# Patient Record
Sex: Male | Born: 1985
Health system: Southern US, Community
[De-identification: ages and names within clinical notes are randomized; demographics above are authoritative.]

## PROBLEM LIST (undated history)

## (undated) DIAGNOSIS — E119 Type 2 diabetes mellitus without complications: Secondary | ICD-10-CM

## (undated) HISTORY — PX: KNEE SURGERY: SHX244

---

## 2016-12-28 ENCOUNTER — Ambulatory Visit (INDEPENDENT_AMBULATORY_CARE_PROVIDER_SITE_OTHER): Payer: Self-pay

## 2016-12-28 ENCOUNTER — Other Ambulatory Visit: Payer: Self-pay

## 2016-12-28 ENCOUNTER — Encounter (HOSPITAL_COMMUNITY): Payer: Self-pay | Admitting: Emergency Medicine

## 2016-12-28 ENCOUNTER — Ambulatory Visit (HOSPITAL_COMMUNITY)
Admission: EM | Admit: 2016-12-28 | Discharge: 2016-12-28 | Disposition: A | Discharge status: Home / Self Care | Payer: Self-pay | Attending: Family Medicine | Admitting: Family Medicine

## 2016-12-28 DIAGNOSIS — M25562 Pain in left knee: Secondary | ICD-10-CM

## 2016-12-28 DIAGNOSIS — M25462 Effusion, left knee: Secondary | ICD-10-CM

## 2016-12-28 MED ORDER — PREDNISONE 20 MG PO TABS
ORAL_TABLET | ORAL | 0 refills | Status: DC
Start: 2016-12-28 — End: 2018-08-13

## 2016-12-28 NOTE — Discharge Instructions (Signed)
X-rays show no bone damage. Use the knee sleeve. 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 If symptoms continue, you will need to see your orthopedist again.

## 2016-12-28 NOTE — ED Provider Notes (Signed)
  [facility]   [id number] 12/28/16 Arrival Time: 1235   SUBJECTIVE:  Dustin Giles is a 31 y.o. male who presents to the urgent care with complaint of left knee swelling and pain.  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   History reviewed. No pertinent past medical history. Family History  Problem Relation Age of Onset  . Hypertension Father    Social History   Socioeconomic History  . Marital status: Single    Spouse name: Not on file  . Number of children: Not on file  . Years of education: Not on file  . Highest education level: Not on file  Social Needs  . Financial resource strain: Not on file  . Food insecurity - worry: Not on file  . Food insecurity - inability: Not on file  . Transportation needs - medical: Not on file  . Transportation needs - non-medical: Not on file  Occupational History  . Not on file  Tobacco Use  . Smoking status: Never Smoker  Substance and Sexual Activity  . Alcohol use: No    Frequency: Never  . Drug use: Not on file  . Sexual activity: Not on file  Other Topics Concern  . Not on file  Social History Narrative  . Not on file   No outpatient medications have been marked as taking for the 12/28/16 encounter [facility](Hospital Encounter).   No Known Allergies    ROS: As per HPI, remainder of ROS negative.   OBJECTIVE:   Vitals:   12/28/16 1321  BP: 129/75  Pulse: 75  Resp: 16  Temp: 98.4 F (36.9 C)  SpO2: 100%     General appearance: alert; no distress Eyes: PERRL; EOMI; conjunctiva normal HENT: normocephalic; atraumatic; Neck: supple Back: no CVA tenderness Extremities: no cyanosis or edema; symmetrical with left knee having a mild to moderate knee effusion obvious.  There is a medial  arthroscopic surgery scar.  There is no localized tenderness.  There is no ligamentous laxity. Skin: warm and dry Neurologic: normal gait; grossly normal Psychological: alert and cooperative; normal mood and affect      Labs:  No results found for this or any previous visit.  Labs Reviewed - No data to display  No results found.     ASSESSMENT & PLAN:  1. Effusion of left knee     Meds ordered this encounter  Medications  . predniSONE (DELTASONE) 20 MG tablet    Sig: Two daily with food    Dispense:  10 tablet    Refill:  0    Reviewed expectations re: course of current medical issues. Questions answered. Outlined signs and symptoms indicating need for more acute intervention. Patient verbalized understanding. After Visit Summary given.    Procedures:      Elvina SidleLauenstein, Merrell Rettinger, MD 12/28/16 1441

## 2016-12-28 NOTE — ED Triage Notes (Addendum)
Pt states "I went to take a test monday at the [employer], I jumped over a fence, I was fine afterward, the next morning on Tuesday my left knee started hurting with some swelling."

## 2017-06-06 ENCOUNTER — Other Ambulatory Visit: Payer: Self-pay

## 2017-06-06 ENCOUNTER — Encounter (HOSPITAL_COMMUNITY): Payer: Self-pay | Admitting: *Deleted

## 2017-06-06 ENCOUNTER — Ambulatory Visit (HOSPITAL_COMMUNITY)
Admission: EM | Admit: 2017-06-06 | Discharge: 2017-06-06 | Disposition: A | Discharge status: Home / Self Care | Payer: PRIVATE HEALTH INSURANCE | Attending: Internal Medicine | Admitting: Internal Medicine

## 2017-06-06 ENCOUNTER — Ambulatory Visit (INDEPENDENT_AMBULATORY_CARE_PROVIDER_SITE_OTHER): Payer: PRIVATE HEALTH INSURANCE

## 2017-06-06 DIAGNOSIS — S63501A Unspecified sprain of right wrist, initial encounter: Secondary | ICD-10-CM

## 2017-06-06 MED ORDER — IBUPROFEN 800 MG PO TABS: 800.0000 mg | ORAL_TABLET | Freq: Three times a day (TID) | ORAL | 0 refills | Status: DC

## 2017-06-06 NOTE — Discharge Instructions (Signed)
Use anti-inflammatories for pain/swelling. You may take up to 800 mg Ibuprofen every 8 hours with food. You may supplement Ibuprofen with Tylenol [phone] mg every 8 hours.   Apply ice multiple times a day for 20 minutes  Use brace for 1 week, then as needed for comfort

## 2017-06-06 NOTE — ED Triage Notes (Signed)
Playing basketball and fell on his right wrist, per pt it happened on Monday, per pt if he bends it forward it hurts,

## 2017-06-06 NOTE — ED Provider Notes (Signed)
[facility]    CSN: [id number] Arrival date & time: 06/06/17  1423     History   Chief Complaint Chief Complaint  Patient presents with  . Fall    HPI Dustin Giles is a 32 y.o. male presenting today for evaluation of right wrist injury.  Patient states that he was playing basketball on Monday and fell on outstretched hand.  Since he has had persistent pain, pain worsening with flexion, extension and radial deviation.  Noting weird painful sensation over the carpal area of his wrist.  Denies numbness or tingling.  Has been taking Tylenol.  Applying ice.  Patient concerned because he has a physical fit testing next week for the [employer].  HPI  History reviewed. No pertinent past medical history.  There are no active problems to display for this patient.   Past Surgical History:  Procedure Laterality Date  . KNEE SURGERY         Home Medications    Prior to Admission medications   Medication Sig Start Date End Date Taking? Authorizing Provider  ibuprofen (ADVIL,MOTRIN) 800 MG tablet Take 1 tablet (800 mg total) by mouth 3 (three) times daily. 06/06/17   Allsion Nogales C, PA-C  predniSONE (DELTASONE) 20 MG tablet Two daily with food 12/28/16   Elvina Sidle, MD    Family History Family History  Problem Relation Age of Onset  . Hypertension Father     Social History Social History   Tobacco Use  . Smoking status: Never Smoker  . Smokeless tobacco: Never Used  Substance Use Topics  . Alcohol use: No    Frequency: Never  . Drug use: Not on file     Allergies   Patient has no known allergies.   Review of Systems Review of Systems  Constitutional: Negative for activity change, appetite change and fever.  Eyes: Negative for visual disturbance.  Respiratory: Negative for shortness of breath.   Cardiovascular: Negative for chest pain.  Gastrointestinal: Negative for abdominal pain, nausea and vomiting.  Musculoskeletal: Positive for  arthralgias, joint swelling and myalgias. Negative for gait problem, neck pain and neck stiffness.  Skin: Negative for wound.  Neurological: Negative for dizziness, syncope, speech difficulty, weakness, light-headedness, numbness and headaches.     Physical Exam Triage Vital Signs ED Triage Vitals  Enc Vitals Group     BP 06/06/17 1512 131/72     Pulse Rate 06/06/17 1512 64     Resp --      Temp 06/06/17 1512 97.8 F (36.6 C)     Temp Source 06/06/17 1512 Oral     SpO2 06/06/17 1512 100 %     Weight --      Height --      Head Circumference --      Peak Flow --      Pain Score 06/06/17 1513 7     Pain Loc --      Pain Edu? --      Excl. in GC? --    No data found.  Updated Vital Signs BP 131/72 (BP Location: Left Arm)   Pulse 64   Temp 97.8 F (36.6 C) (Oral)   SpO2 100%   Visual Acuity Right Eye Distance:   Left Eye Distance:   Bilateral Distance:    Right Eye Near:   Left Eye Near:    Bilateral Near:     Physical Exam  Constitutional: He appears well-developed and well-nourished.  HENT:  Head: Normocephalic and atraumatic.  Eyes: Conjunctivae are normal.  Neck: Neck supple.  Cardiovascular: Normal rate and regular rhythm.  No murmur heard. Pulmonary/Chest: Effort normal and breath sounds normal. No respiratory distress.  Abdominal: Soft. There is no tenderness.  Musculoskeletal: He exhibits no edema.  No obvious deformity or swelling to right wrist, nontender to palpation of distal radius and ulna, tenderness over carpals, nontender to anatomical snuffbox, radial pulse 2+ cap refill less than 2 seconds.  Neurological: He is alert.  Skin: Skin is warm and dry.  Psychiatric: He has a normal mood and affect.  Nursing note and vitals reviewed.    UC Treatments / Results  Labs (all labs ordered are listed, but only abnormal results are displayed) Labs Reviewed - No data to display  EKG None  Radiology Dg Wrist Complete Right  Result Date:  06/06/2017 CLINICAL DATA:  Fall 6 days ago with pain. EXAM: RIGHT WRIST - COMPLETE 3+ VIEW COMPARISON:  None. FINDINGS: There is no evidence of fracture or dislocation. There is no evidence of arthropathy or other focal bone abnormality. Soft tissues are unremarkable. IMPRESSION: Negative. Electronically Signed   By: Gerome Samavid  Williams III M.D   On: 06/06/2017 15:41    Procedures Procedures (including critical care time)  Medications Ordered in UC Medications - No data to display  Initial Impression / Assessment and Plan / UC Course  I have reviewed the triage vital signs and the nursing notes.  Pertinent labs & imaging results that were available during my care of the patient were reviewed by me and considered in my medical decision making (see chart for details).     X-ray negative for acute fracture.  Will provide wrist brace for support.  Pain likely wrist sprain.  Discussed NSAIDs, ice, expect gradual improvement with time.Discussed strict return precautions. Patient verbalized understanding and is agreeable with plan.  Final Clinical Impressions(s) / UC Diagnoses   Final diagnoses:  Sprain of right wrist, initial encounter     Discharge Instructions     Use anti-inflammatories for pain/swelling. You may take up to 800 mg Ibuprofen every 8 hours with food. You may supplement Ibuprofen with Tylenol [id number] mg every 8 hours.   Apply ice multiple times a day for 20 minutes  Use brace for 1 week, then as needed for comfort   ED Prescriptions    Medication Sig Dispense Auth. Provider   ibuprofen (ADVIL,MOTRIN) 800 MG tablet Take 1 tablet (800 mg total) by mouth 3 (three) times daily. 30 tablet Kynsleigh Westendorf, Clay CenterHallie C, PA-C     Controlled Substance Prescriptions [place] Controlled Substance Registry consulted? Not Applicable   Lew DawesWieters, Kensie Susman C, [place] JerseyPA-C 06/06/17 1820

## 2017-08-10 ENCOUNTER — Ambulatory Visit (HOSPITAL_COMMUNITY)
Admission: EM | Admit: 2017-08-10 | Discharge: 2017-08-10 | Disposition: A | Discharge status: Home / Self Care | Payer: PRIVATE HEALTH INSURANCE | Attending: Family Medicine | Admitting: Family Medicine

## 2017-08-10 ENCOUNTER — Encounter (HOSPITAL_COMMUNITY): Payer: Self-pay

## 2017-08-10 DIAGNOSIS — W19XXXA Unspecified fall, initial encounter: Secondary | ICD-10-CM | POA: Diagnosis not present

## 2017-08-10 DIAGNOSIS — S0101XA Laceration without foreign body of scalp, initial encounter: Secondary | ICD-10-CM | POA: Diagnosis not present

## 2017-08-10 DIAGNOSIS — Z23 Encounter for immunization: Secondary | ICD-10-CM

## 2017-08-10 DIAGNOSIS — Y9367 Activity, basketball: Secondary | ICD-10-CM

## 2017-08-10 MED ORDER — TETANUS-DIPHTH-ACELL PERTUSSIS 5-2.5-18.5 LF-MCG/0.5 IM SUSP
0.5000 mL | Freq: Once | INTRAMUSCULAR | Status: AC
  Administered 2017-08-10: 0.5 mL via INTRAMUSCULAR

## 2017-08-10 MED ORDER — TETANUS-DIPHTH-ACELL PERTUSSIS 5-2.5-18.5 LF-MCG/0.5 IM SUSP
INTRAMUSCULAR | Status: AC
  Filled 2017-08-10: qty 0.5

## 2017-08-10 NOTE — ED Triage Notes (Signed)
Pt presents with head laceration from fall

## 2017-08-10 NOTE — ED Provider Notes (Signed)
[facility]    CSN: [id number] Arrival date & time: 08/10/17  1506     History   Chief Complaint Chief Complaint  Patient presents with  . Laceration    back of head    HPI Dustin Giles is a 32 y.o. male.   [name] presents with complaints of laceration to posterior head after a fall prior to arrival. He was playing basketball, jumped up for a rebound, his legs were taken out from under him during landing causing him to fall back and strike his head on the gym floor. Did not lose consciousness. No headache. No vision change. No nausea or vomiting. No neck pain. Unknown last tdap. Has not cleansed the wound. States had continued playing until another player noted blood on the back of his head. Without contributing medical history.     ROS per HPI.      History reviewed. No pertinent past medical history.  There are no active problems to display for this patient.   Past Surgical History:  Procedure Laterality Date  . KNEE SURGERY         Home Medications    Prior to Admission medications   Medication Sig Start Date End Date Taking? Authorizing Provider  ibuprofen (ADVIL,MOTRIN) 800 MG tablet Take 1 tablet (800 mg total) by mouth 3 (three) times daily. 06/06/17   Wieters, Hallie C, PA-C  predniSONE (DELTASONE) 20 MG tablet Two daily with food 12/28/16   Elvina Sidle, MD    Family History Family History  Problem Relation Age of Onset  . Hypertension Father     Social History Social History   Tobacco Use  . Smoking status: Never Smoker  . Smokeless tobacco: Never Used  Substance Use Topics  . Alcohol use: No    Frequency: Never  . Drug use: Not on file     Allergies   Patient has no known allergies.   Review of Systems Review of Systems   Physical Exam Triage Vital Signs ED Triage Vitals [08/10/17 1525]  Enc Vitals Group     BP (!) 145/84     Pulse Rate 72     Resp 20     Temp 98.1 F (36.7 C)     Temp Source Temporal     SpO2 99 %     Weight      Height      Head Circumference      Peak Flow      Pain Score      Pain Loc      Pain Edu?      Excl. in GC?    No data found.  Updated Vital Signs BP (!) 145/84 (BP Location: Left Arm)   Pulse 72   Temp 98.1 F (36.7 C) (Temporal)   Resp 20   SpO2 99%    Physical Exam  Constitutional: He is oriented to person, place, and time. He appears well-developed and well-nourished.  HENT:  Head:    Right Ear: External ear normal.  Left Ear: External ear normal.  Approximately 0.5 cm laceration to posterior scalp; cleansed, bleeding controlled   Eyes: Pupils are equal, round, and reactive to light. Conjunctivae and EOM are normal.  Neck: Normal range of motion. No spinous process tenderness and no muscular tenderness present. No neck rigidity. Normal range of motion present.  Cardiovascular: Normal rate and regular rhythm.  Pulmonary/Chest: Effort normal and breath sounds normal.  Musculoskeletal: Normal range of motion.  Neurological: He is alert  and oriented to person, place, and time.  Skin: Skin is warm and dry.     UC Treatments / Results  Labs (all labs ordered are listed, but only abnormal results are displayed) Labs Reviewed - No data to display  EKG None  Radiology No results found.  Procedures Laceration Repair Date/Time: 08/10/2017 4:12 PM Performed by: Georgetta HaberBurky, Olukemi Panchal B, NP Authorized by: Mardella LaymanHagler, Brian, MD   Consent:    Consent obtained:  Verbal   Consent given by:  Patient   Risks discussed:  Infection, pain, poor cosmetic result, poor wound healing and need for additional repair   Alternatives discussed:  No treatment, referral and observation Anesthesia (see MAR for exact dosages):    Anesthesia method:  None Laceration details:    Location:  Scalp   Scalp location:  Occipital   Length (cm):  0.5   Depth (mm):  1 Repair type:    Repair type:  Simple Exploration:    Wound exploration: wound explored through full  range of motion     Wound extent: no foreign bodies/material noted, no muscle damage noted, no nerve damage noted, no tendon damage noted and no underlying fracture noted     Contaminated: no   Treatment:    Area cleansed with:  Betadine (hydrogen peroxide )   Amount of cleaning:  Standard Skin repair:    Repair method:  Staples   Number of staples:  1 Approximation:    Approximation:  Close Post-procedure details:    Dressing:  Bulky dressing   Patient tolerance of procedure:  Tolerated well, no immediate complications   (including critical care time)  Medications Ordered in UC Medications  Tdap (BOOSTRIX) injection 0.5 mL (0.5 mLs Intramuscular Given 08/10/17 1600)    Initial Impression / Assessment and Plan / UC Course  I have reviewed the triage vital signs and the nursing notes.  Pertinent labs & imaging results that were available during my care of the patient were reviewed by me and considered in my medical decision making (see chart for details).     No indication of intracranial injury. No neck pain. Wound cleansed thoroughly and closed with 1 staple. tdap updated. Follow up in 7-10 days for staple removal. Wound care discussed. Patient verbalized understanding and agreeable to plan.    Final Clinical Impressions(s) / UC Diagnoses   Final diagnoses:  Laceration of scalp without foreign body, initial encounter     Discharge Instructions     Keep this dressing in place for the next few hours to control any oozing blood.  May wash with soap and water daily, pat dry.  Try to avoid touching to keep clean.  Return in 7-10 days for staple removal.  If develop increased pain, drainage, redness fevers please return. If you develop increased headache, vision change, nausea or vomiting please return or go to the Er.     ED Prescriptions    None     Controlled Substance Prescriptions [place] Controlled Substance Registry consulted? Not Applicable   Georgetta HaberBurky, Taeler Winning B,  NP 08/10/17 1614

## 2017-08-10 NOTE — Discharge Instructions (Signed)
Keep this dressing in place for the next few hours to control any oozing blood.  May wash with soap and water daily, pat dry.  Try to avoid touching to keep clean.  Return in 7-10 days for staple removal.  If develop increased pain, drainage, redness fevers please return. If you develop increased headache, vision change, nausea or vomiting please return or go to the Er.

## 2017-08-18 ENCOUNTER — Ambulatory Visit (HOSPITAL_COMMUNITY)
Admission: EM | Admit: 2017-08-18 | Discharge: 2017-08-18 | Disposition: A | Discharge status: Home / Self Care | Payer: PRIVATE HEALTH INSURANCE

## 2017-08-18 DIAGNOSIS — S0101XD Laceration without foreign body of scalp, subsequent encounter: Secondary | ICD-10-CM | POA: Diagnosis not present

## 2017-08-18 DIAGNOSIS — Z4802 Encounter for removal of sutures: Secondary | ICD-10-CM

## 2017-08-18 NOTE — ED Notes (Signed)
Pt presented to have staple removed from back of head Wound was clean, dry and well healed Pt tolerated staple removal well One staple was removed

## 2018-01-05 ENCOUNTER — Observation Stay (HOSPITAL_COMMUNITY)
Admission: EM | Admit: 2018-01-05 | Discharge: 2018-01-06 | Discharge status: Against Medical Advice | Payer: PRIVATE HEALTH INSURANCE | Attending: Internal Medicine | Admitting: Internal Medicine

## 2018-01-05 ENCOUNTER — Other Ambulatory Visit: Payer: Self-pay

## 2018-01-05 DIAGNOSIS — Z791 Long term (current) use of non-steroidal anti-inflammatories (NSAID): Secondary | ICD-10-CM | POA: Insufficient documentation

## 2018-01-05 DIAGNOSIS — Z5329 Procedure and treatment not carried out because of patient's decision for other reasons: Principal | ICD-10-CM | POA: Insufficient documentation

## 2018-01-05 DIAGNOSIS — E119 Type 2 diabetes mellitus without complications: Secondary | ICD-10-CM

## 2018-01-05 HISTORY — DX: Type 2 diabetes mellitus without complications: E11.9

## 2018-01-05 LAB — BASIC METABOLIC PANEL
Anion gap: 16 — ABNORMAL HIGH (ref 5–15)
BUN: 14 mg/dL (ref 6–20)
CHLORIDE: 90 mmol/L — AB (ref 98–111)
CO2: 24 mmol/L (ref 22–32)
CREATININE: 1.27 mg/dL — AB (ref 0.61–1.24)
Calcium: 9.5 mg/dL (ref 8.9–10.3)
GFR calc Af Amer: >60 mL/min (ref >60)
GFR calc non Af Amer: >60 mL/min (ref >60)
GLUCOSE: 617 mg/dL — AB (ref 70–99)
POTASSIUM: 4.7 mmol/L (ref 3.5–5.1)
Sodium: 130 mmol/L — ABNORMAL LOW (ref 135–145)

## 2018-01-05 LAB — URINALYSIS, ROUTINE W REFLEX MICROSCOPIC
BACTERIA UA: NONE SEEN
Bilirubin Urine: NEGATIVE
Hgb urine dipstick: NEGATIVE
Ketones, ur: 80 mg/dL — AB
Leukocytes, UA: NEGATIVE
NITRITE: NEGATIVE
PH: 5 (ref 5.0–8.0)
Protein, ur: NEGATIVE mg/dL
Specific Gravity, Urine: 1.029 (ref 1.005–1.030)

## 2018-01-05 LAB — CBC
HCT: 43 % (ref 39.0–52.0)
HEMOGLOBIN: 14.5 g/dL (ref 13.0–17.0)
MCH: 26.5 pg (ref 26.0–34.0)
MCHC: 33.7 g/dL (ref 30.0–36.0)
MCV: 78.5 fL — AB (ref 80.0–100.0)
Platelets: 265 10*3/uL (ref 150–400)
RBC: 5.48 MIL/uL (ref 4.22–5.81)
RDW: 13.3 % (ref 11.5–15.5)
WBC: 7.8 10*3/uL (ref 4.0–10.5)
nRBC: 0.3 % — ABNORMAL HIGH (ref 0.0–0.2)

## 2018-01-05 NOTE — ED Triage Notes (Signed)
Patient states that he has been urinating frequently and thinks he has new onset diabetes.

## 2018-01-06 ENCOUNTER — Encounter (HOSPITAL_COMMUNITY): Payer: Self-pay | Admitting: General Practice

## 2018-01-06 DIAGNOSIS — E119 Type 2 diabetes mellitus without complications: Secondary | ICD-10-CM

## 2018-01-06 LAB — BASIC METABOLIC PANEL
Anion gap: 14 (ref 5–15)
Anion gap: 12 (ref 5–15)
BUN: 11 mg/dL (ref 6–20)
BUN: 9 mg/dL (ref 6–20)
CHLORIDE: 106 mmol/L (ref 98–111)
CO2: 21 mmol/L — ABNORMAL LOW (ref 22–32)
CO2: 23 mmol/L (ref 22–32)
Calcium: 8.3 mg/dL — ABNORMAL LOW (ref 8.9–10.3)
Calcium: 8.6 mg/dL — ABNORMAL LOW (ref 8.9–10.3)
Chloride: 102 mmol/L (ref 98–111)
Creatinine, Ser: 1.08 mg/dL (ref 0.61–1.24)
Creatinine, Ser: 0.97 mg/dL (ref 0.61–1.24)
GFR calc Af Amer: >60 mL/min (ref >60)
GFR calc Af Amer: >60 mL/min (ref >60)
GFR calc non Af Amer: >60 mL/min (ref >60)
GFR calc non Af Amer: >60 mL/min (ref >60)
Glucose, Bld: 306 mg/dL — ABNORMAL HIGH (ref 70–99)
Glucose, Bld: 88 mg/dL (ref 70–99)
Potassium: 3.6 mmol/L (ref 3.5–5.1)
Potassium: 3.1 mmol/L — ABNORMAL LOW (ref 3.5–5.1)
SODIUM: 137 mmol/L (ref 135–145)
Sodium: 141 mmol/L (ref 135–145)

## 2018-01-06 LAB — HIV ANTIBODY (ROUTINE TESTING W REFLEX): HIV Screen 4th Generation wRfx: NONREACTIVE

## 2018-01-06 LAB — CBG MONITORING, ED
GLUCOSE-CAPILLARY: 294 mg/dL — AB (ref 70–99)
Glucose-Capillary: 271 mg/dL — ABNORMAL HIGH (ref 70–99)
Glucose-Capillary: 274 mg/dL — ABNORMAL HIGH (ref 70–99)
Glucose-Capillary: 182 mg/dL — ABNORMAL HIGH (ref 70–99)
Glucose-Capillary: 116 mg/dL — ABNORMAL HIGH (ref 70–99)
Glucose-Capillary: 55 mg/dL — ABNORMAL LOW (ref 70–99)
Glucose-Capillary: 100 mg/dL — ABNORMAL HIGH (ref 70–99)
Glucose-Capillary: 267 mg/dL — ABNORMAL HIGH (ref 70–99)

## 2018-01-06 LAB — GLUCOSE, CAPILLARY: Glucose-Capillary: 239 mg/dL — ABNORMAL HIGH (ref 70–99)

## 2018-01-06 MED ORDER — INSULIN ASPART 100 UNIT/ML ~~LOC~~ SOLN: 0.0000 [IU] | Freq: Every day | SUBCUTANEOUS | Status: DC

## 2018-01-06 MED ORDER — DEXTROSE 50 % IV SOLN: 25.0000 mL | INTRAVENOUS | Status: DC | PRN

## 2018-01-06 MED ORDER — INSULIN GLARGINE 100 UNIT/ML ~~LOC~~ SOLN
20.0000 [IU] | SUBCUTANEOUS | Status: DC
  Administered 2018-01-06: 20 [IU] via SUBCUTANEOUS
  Filled 2018-01-06 (×2): qty 0.2

## 2018-01-06 MED ORDER — SODIUM CHLORIDE 0.9 % IV BOLUS
1000.0000 mL | Freq: Once | INTRAVENOUS | Status: AC
  Administered 2018-01-06: 1000 mL via INTRAVENOUS

## 2018-01-06 MED ORDER — ACETAMINOPHEN 650 MG RE SUPP: 650.0000 mg | Freq: Four times a day (QID) | RECTAL | Status: DC | PRN

## 2018-01-06 MED ORDER — LIVING WELL WITH DIABETES BOOK
Freq: Once | Status: DC
  Filled 2018-01-06: qty 1

## 2018-01-06 MED ORDER — POTASSIUM CHLORIDE CRYS ER 20 MEQ PO TBCR
20.0000 meq | EXTENDED_RELEASE_TABLET | Freq: Once | ORAL | Status: AC
  Administered 2018-01-06: 20 meq via ORAL
  Filled 2018-01-06: qty 1

## 2018-01-06 MED ORDER — METFORMIN HCL 500 MG PO TABS: 500.0000 mg | ORAL_TABLET | Freq: Every day | ORAL | Status: DC

## 2018-01-06 MED ORDER — INSULIN REGULAR(HUMAN) IN NACL 100-0.9 UT/100ML-% IV SOLN
INTRAVENOUS | Status: DC
  Administered 2018-01-06: 5.4 [IU]/h via INTRAVENOUS
  Filled 2018-01-06: qty 100

## 2018-01-06 MED ORDER — ONDANSETRON HCL 4 MG PO TABS: 4.0000 mg | ORAL_TABLET | Freq: Four times a day (QID) | ORAL | Status: DC | PRN

## 2018-01-06 MED ORDER — DEXTROSE-NACL 5-0.45 % IV SOLN
INTRAVENOUS | Status: DC
  Administered 2018-01-06: 07:00:00 via INTRAVENOUS

## 2018-01-06 MED ORDER — SODIUM CHLORIDE 0.9 % IV SOLN: INTRAVENOUS | Status: DC

## 2018-01-06 MED ORDER — ENOXAPARIN SODIUM 40 MG/0.4ML ~~LOC~~ SOLN
40.0000 mg | SUBCUTANEOUS | Status: DC
  Administered 2018-01-06: 40 mg via SUBCUTANEOUS
  Filled 2018-01-06 (×2): qty 0.4

## 2018-01-06 MED ORDER — DEXTROSE-NACL 5-0.45 % IV SOLN: INTRAVENOUS | Status: DC

## 2018-01-06 MED ORDER — ONDANSETRON HCL 4 MG/2ML IJ SOLN: 4.0000 mg | Freq: Four times a day (QID) | INTRAMUSCULAR | Status: DC | PRN

## 2018-01-06 MED ORDER — ACETAMINOPHEN 325 MG PO TABS: 650.0000 mg | ORAL_TABLET | Freq: Four times a day (QID) | ORAL | Status: DC | PRN

## 2018-01-06 MED ORDER — INSULIN GLARGINE 100 UNIT/ML ~~LOC~~ SOLN
20.0000 [IU] | Freq: Every day | SUBCUTANEOUS | Status: DC
  Filled 2018-01-06: qty 0.2

## 2018-01-06 MED ORDER — INSULIN ASPART 100 UNIT/ML ~~LOC~~ SOLN
0.0000 [IU] | Freq: Three times a day (TID) | SUBCUTANEOUS | Status: DC
  Administered 2018-01-06: 11 [IU] via SUBCUTANEOUS
  Administered 2018-01-06: 7 [IU] via SUBCUTANEOUS

## 2018-01-06 NOTE — H&P (Addendum)
History and Physical    Dustin Giles RUE:[id number] DOB: 04/22/1985 DOA: 01/05/2018  PCP: Patient, No Pcp Per  Patient coming from: Home  I have personally briefly [facility]  Chief Complaint: Hyperglycemia  HPI: Dustin Giles is a 33 y.o. male with medical history significant of previously healthy.  Patient presents to the ED with polydipsia, polyuria over the past 1 month or so.  Co-worker noticed him going to the bathroom all the time and suggested he might be diabetic.  He has no PCP and didn't know where to go so he went to the ED.   ED Course: BGL 612. (Co-worker appears to be correct).  Admits to eating a lot of sweats, colas.   Review of Systems: As per HPI otherwise 10 point review of systems negative.   No past medical history on file.  Past Surgical History:  Procedure Laterality Date  . KNEE SURGERY       reports that he has never smoked. He has never used smokeless tobacco. He reports that he does not drink alcohol. No history on file for drug.  No Known Allergies  Family History  Problem Relation Age of Onset  . Hypertension Father    No FHx of DM.  Prior to Admission medications   Medication Sig Start Date End Date Taking? Authorizing Provider  Multiple Vitamin (MULTIVITAMIN WITH MINERALS) TABS tablet Take 1 tablet by mouth daily.   Yes [provider]  ibuprofen (ADVIL,MOTRIN) 800 MG tablet Take 1 tablet (800 mg total) by mouth 3 (three) times daily. Patient not taking: Reported on 01/06/2018 06/06/17   Wieters, Fran LowesHallie C, PA-C  predniSONE (DELTASONE) 20 MG tablet Two daily with food Patient not taking: Reported on 01/06/2018 12/28/16   Elvina SidleLauenstein, Kurt, MD    Physical Exam: Vitals:   01/06/18 0130 01/06/18 0215 01/06/18 0245 01/06/18 0300  BP: 129/79 128/78 124/75 114/75  Pulse: 60 64  (!) 58  Resp: 15 18 17 17   Temp:      TempSrc:      SpO2: 98% 96%  97%  Weight:      Height:          Constitutional: NAD, calm, comfortable Eyes: PERRL, lids and conjunctivae normal ENMT: Mucous membranes are moist. Posterior pharynx clear of any exudate or lesions.Normal dentition.  Neck: normal, supple, no masses, no thyromegaly Respiratory: clear to auscultation bilaterally, no wheezing, no crackles. Normal respiratory effort. No accessory muscle use.  Cardiovascular: Regular rate and rhythm, no murmurs / rubs / gallops. No extremity edema. 2+ pedal pulses. No carotid bruits.  Abdomen: no tenderness, no masses palpated. No hepatosplenomegaly. Bowel sounds positive.  Musculoskeletal: no clubbing / cyanosis. No joint deformity upper and lower extremities. Good ROM, no contractures. Normal muscle tone.  Skin: no rashes, lesions, ulcers. No induration Neurologic: CN 2-12 grossly intact. Sensation intact, DTR normal. Strength 5/5 in all 4.  Psychiatric: Normal judgment and insight. Alert and oriented x 3. Normal mood.    Labs on Admission: I have personally reviewed following labs and imaging studies  CBC: Recent Labs  Lab 01/05/18 2148  WBC 7.8  HGB 14.5  HCT 43.0  MCV 78.5*  PLT 265   Basic Metabolic Panel: Recent Labs  Lab 01/05/18 2148  NA 130*  K 4.7  CL 90*  CO2 24  GLUCOSE 617*  BUN 14  CREATININE 1.27*  CALCIUM 9.5   GFR: Estimated Creatinine Clearance: 104.7 mL/min (A) (by C-G formula based  on SCr of 1.27 mg/dL (H)). Liver Function Tests: No results for input(s): AST, ALT, ALKPHOS, BILITOT, PROT, ALBUMIN in the last 168 hours. No results for input(s): LIPASE, AMYLASE in the last 168 hours. No results for input(s): AMMONIA in the last 168 hours. Coagulation Profile: No results for input(s): INR, PROTIME in the last 168 hours. Cardiac Enzymes: No results for input(s): CKTOTAL, CKMB, CKMBINDEX, TROPONINI in the last 168 hours. BNP (last 3 results) No results for input(s): PROBNP in the last 8760 hours. HbA1C: No results for input(s): HGBA1C in the last 72  hours. CBG: Recent Labs  Lab 01/06/18 0245  GLUCAP 294*   Lipid Profile: No results for input(s): CHOL, HDL, LDLCALC, TRIG, CHOLHDL, LDLDIRECT in the last 72 hours. Thyroid Function Tests: No results for input(s): TSH, T4TOTAL, FREET4, T3FREE, THYROIDAB in the last 72 hours. Anemia Panel: No results for input(s): VITAMINB12, FOLATE, FERRITIN, TIBC, IRON, RETICCTPCT in the last 72 hours. Urine analysis:    Component Value Date/Time   COLORURINE STRAW (A) 01/05/2018 2145   APPEARANCEUR CLEAR 01/05/2018 2145   LABSPEC 1.029 01/05/2018 2145   PHURINE 5.0 01/05/2018 2145   GLUCOSEU >=500 (A) 01/05/2018 2145   HGBUR NEGATIVE 01/05/2018 2145   BILIRUBINUR NEGATIVE 01/05/2018 2145   KETONESUR 80 (A) 01/05/2018 2145   PROTEINUR NEGATIVE 01/05/2018 2145   NITRITE NEGATIVE 01/05/2018 2145   LEUKOCYTESUR NEGATIVE 01/05/2018 2145    Radiological Exams on Admission: No results found.  EKG: Independently reviewed.  Assessment/Plan Active Problems:   New onset type 2 diabetes mellitus (HCC)    1. DM2 new onset - if DKA then very mild with AG 16 1. Glucostabilizer for initial BGL control 2. A1C 3. BMP Q4H to keep eye on AG and K 4. Got IV K in ED, will give 20 meq PO K now 5. Diabetes coordinator consult in AM 6. Needs PCP follow up  DVT prophylaxis: Lovenox Code Status: Full Family Communication: No family in room Disposition Plan: Home after admit Consults called: None Admission status: Place in obs    Dustin Giles, Heywood Iles. DO [facility] Pager [phone] Only works nights!  If 7AM-7PM, please contact the primary day team physician taking care of patient  [email or web address] Password [facility]  01/06/2018, 3:42 AM

## 2018-01-06 NOTE — Progress Notes (Signed)
Patient admitted after midnight, please see H&P.  Here with new onset type 2 DM.  Diet is poor.  Will d/c insulin gtt.  Start lantus and SSI For now-- await HgbA1c to help direct medications.  Needs further education on diet and checking blood sugars Also needs PCP-- says insurance is only for accidents-- care management consult.  Marlin Canary DO

## 2018-01-06 NOTE — ED Notes (Signed)
CBG was 55. Insulin gtt stopped. Pt a&ox4. Will recheck CBG in 15 minutes.

## 2018-01-06 NOTE — Progress Notes (Addendum)
Date: 01/06/2018 Patient: Dustin Giles Admitted: 01/05/2018  9:06 PM Attending Provider: Joseph Art, DO  Dustin Giles has made the decision for the patient to leave against the advice of Joseph Art, DO.  He  has been informed and understands the inherent risks, including death.    Dustin Giles  has signed the Leaving Against Medical Advice form prior to leaving the department.  Dustin Giles Dustin Giles 01/06/2018

## 2018-01-06 NOTE — ED Notes (Signed)
Diet tray ordered 

## 2018-01-06 NOTE — Progress Notes (Signed)
Inpatient Diabetes Program Recommendations  AACE/ADA: New Consensus Statement on Inpatient Glycemic Control (2015)  Target Ranges:  Prepandial:   less than 140 mg/dL      Peak postprandial:   less than 180 mg/dL (1-2 hours)      Critically ill patients:  140 - 180 mg/dL   Lab Results  Component Value Date   GLUCAP 267 (H) 01/06/2018    Review of Glycemic Control  Received page from RN requesting Coordinator talk with pt about his new diagnosis of DM. RN states pt thinks he doesn't have diabetes d/t no family hx DM and pt states he is leaving AMA. 33 year old male presents with several weeks polyuria, polydipsia. Concerning for new onset diabetes and evidence of early DKA.  Diabetes history: None Outpatient Diabetes medications: N/A Current orders for Inpatient glycemic control: Lantus 20 units Q24H, Novolog 0-20 units tidwc and hs, metformin 500 mg QAM  Awaiting HgbA1C results. Initially AG - 16 and CO2 24, 21, 23 Urine ketones 80 and glucose > 500. Labs indicate AG closed, no acidosis at present. Had hypoglycemia of 55 and IV insulin d/ced. Lantus 20 units given and blood sugars 100, 267, 239.  Inpatient Diabetes Program Recommendations:     Add Novolog 4 units tidwc for meal coverage insulin if pt eats > 50% meal. Will order Living Well with Diabetes book.  Needs PCP for diabetes management. RN states pt has viewed diabetes videos on pt ed channel. Will need prescription for meter and supplies. Likely will need affordable insulin since pt states his insurance only covers accidents.  Spoke with pt on telephone per RN request, about new diagnosis of diabetes. Pt states he has not exercised lately and has been eating a lot of junk food, sweets and sodas. Does not think he has diabetes. Wants to leave AMA. Explained importance of staying overnight for stabilizaiton of blood sugars, on-going diabetes education, continued workup and management.  Diabetes Coordinator to see in am  for new-onset if pt willing to stay. Returned call to RN after speaking with pt.   Thank you. Ailene Ards, RD, LDN, CDE Inpatient Diabetes Coordinator [phone]

## 2018-01-06 NOTE — ED Provider Notes (Signed)
[facility] EMERGENCY DEPARTMENT Provider Note   CSN: [id number] Arrival date & time: 01/05/18  2104     History   Chief Complaint Chief Complaint  Patient presents with  . Hyperglycemia    HPI Dustin Giles is a 33 y.o. male.  The history is provided by the patient and medical records.    33 year old male with no significant past medical history presenting to the ED with concern of elevated blood sugar.  States over the past several weeks he has noticed excessive thirst and drinking large amounts of water during the day.  Has also had a lot of urinary frequency but states urine is mostly been clear in color.  He denies any dysuria or hematuria.  States he has noticed some intermittent blurred vision, especially after eating something sweet but shortly after will return to normal.  He is also had some tingling of his fingertips and in his toes.  States he previously used to eat very healthy and exercise quite a bit, but his wife is currently pregnant so he has "fallen off the wagon a little bit".  He denies any chest pain or shortness of breath.  No past medical history on file.  There are no active problems to display for this patient.   Past Surgical History:  Procedure Laterality Date  . KNEE SURGERY          Home Medications    Prior to Admission medications   Medication Sig Start Date End Date Taking? Authorizing Provider  ibuprofen (ADVIL,MOTRIN) 800 MG tablet Take 1 tablet (800 mg total) by mouth 3 (three) times daily. 06/06/17   Wieters, Hallie C, PA-C  predniSONE (DELTASONE) 20 MG tablet Two daily with food 12/28/16   Elvina Sidle, MD    Family History Family History  Problem Relation Age of Onset  . Hypertension Father     Social History Social History   Tobacco Use  . Smoking status: Never Smoker  . Smokeless tobacco: Never Used  Substance Use Topics  . Alcohol use: No    Frequency: Never  . Drug use: Not on file      Allergies   Patient has no known allergies.   Review of Systems Review of Systems  Eyes: Positive for visual disturbance.  Endocrine: Positive for polydipsia and polyuria.  All other systems reviewed and are negative.    Physical Exam Updated Vital Signs BP (!) 143/89   Pulse 81   Temp 97.8 F (36.6 C) (Oral)   Resp 17   Ht 5' 11.5" (1.816 m)   Wt 108.9 kg   SpO2 97%   BMI 33.01 kg/m   Physical Exam Vitals signs and nursing note reviewed.  Constitutional:      Appearance: He is well-developed.  HENT:     Head: Normocephalic and atraumatic.     Comments: Dry mucous membranes Eyes:     Conjunctiva/sclera: Conjunctivae normal.     Pupils: Pupils are equal, round, and reactive to light.  Neck:     Musculoskeletal: Normal range of motion.  Cardiovascular:     Rate and Rhythm: Normal rate and regular rhythm.     Heart sounds: Normal heart sounds.  Pulmonary:     Effort: Pulmonary effort is normal.     Breath sounds: Normal breath sounds. No stridor. No rhonchi.  Abdominal:     General: Bowel sounds are normal.     Palpations: Abdomen is soft.  Musculoskeletal: Normal range of motion.  Skin:  General: Skin is warm and dry.  Neurological:     Mental Status: He is alert and oriented to person, place, and time.      ED Treatments / Results  Labs (all labs ordered are listed, but only abnormal results are displayed) Labs Reviewed  BASIC METABOLIC PANEL - Abnormal; Notable for the following components:      Result Value   Sodium 130 (*)    Chloride 90 (*)    Glucose, Bld 617 (*)    Creatinine, Ser 1.27 (*)    Anion gap 16 (*)    All other components within normal limits  CBC - Abnormal; Notable for the following components:   MCV 78.5 (*)    nRBC 0.3 (*)    All other components within normal limits  URINALYSIS, ROUTINE W REFLEX MICROSCOPIC - Abnormal; Notable for the following components:   Color, Urine STRAW (*)    Glucose, UA >=500 (*)     Ketones, ur 80 (*)    All other components within normal limits    EKG None  Radiology No results found.  Procedures Procedures (including critical care time)  CRITICAL CARE Performed by: Garlon HatchetLisa M Kadesha Virrueta   Total critical care time: 35 minutes  Critical care time was exclusive of separately billable procedures and treating other patients.  Critical care was necessary to treat or prevent imminent or life-threatening deterioration.  Critical care was time spent personally by me on the following activities: development of treatment plan with patient and/or surrogate as well as nursing, discussions with consultants, evaluation of patient's response to treatment, examination of patient, obtaining history from patient or surrogate, ordering and performing treatments and interventions, ordering and review of laboratory studies, ordering and review of radiographic studies, pulse oximetry and re-evaluation of patient's condition.   Medications Ordered in ED Medications  insulin regular, human (MYXREDLIN) 100 units/ 100 mL infusion (has no administration in time range)  sodium chloride 0.9 % bolus 1,000 mL (has no administration in time range)    And  sodium chloride 0.9 % bolus 1,000 mL (has no administration in time range)  dextrose 5 %-0.45 % sodium chloride infusion (has no administration in time range)  dextrose 50 % solution 25 mL (has no administration in time range)  0.9 %  sodium chloride infusion (has no administration in time range)     Initial Impression / Assessment and Plan / ED Course  I have reviewed the triage vital signs and the nursing notes.  Pertinent labs & imaging results that were available during my care of the patient were reviewed by me and considered in my medical decision making (see chart for details).  33 year old male presenting to the ED with concern of elevated blood sugars.  Over the past several weeks has had some intermittent blurred vision,  increased thirst, and frequent urination.  He has no history of diabetes or other past medical history, only takes vitamin supplements.  Patient's glucose here is 617 consistent with new onset diabetes.  Anion gap mildly elevated at 16, 80 ketones in the urine.  Results were discussed with patient, he is very upset by this.  I discussed the importance of getting his sugar under control and starting him on medications to better control this and that he will need to establish care with PCP for ongoing management.  2 L fluid bolus ordered, will start insulin drip.  Patient will require admission.  Will likely benefit from some diabetes education.  Discussed with Dr. Julian ReilGardner,  will admit for ongoing care.  Final Clinical Impressions(s) / ED Diagnoses   Final diagnoses:  New onset type 2 diabetes mellitus [facility](HCC)    ED Discharge Orders    None       Garlon HatchetSanders, Lakyla Biswas M, PA-C 01/06/18 0131    Nira Connardama, Pedro Eduardo, MD 01/06/18 [phone]

## 2018-01-06 NOTE — Care Management (Signed)
ED CM met with patient at bedside, patient is a new onset diabetic.  Patient is uninsured and does not have a PCP.   Discussed the [facility] and the [facility]  Program. Patient made aware that Unit CM will follow up concerning transitional care, patient verbalized understanding teach back done.

## 2018-01-06 NOTE — ED Notes (Signed)
CBG 267 

## 2018-01-06 NOTE — Progress Notes (Signed)
Patient arrived from the ED. Alert and oriented x 4. IV: NSL. No telemetry orders. Call bell within reach. Will continue to monitor.   Lillia Pauls RN

## 2018-01-07 LAB — HEMOGLOBIN A1C
Hgb A1c MFr Bld: 16.9 % — ABNORMAL HIGH (ref 4.8–5.6)
Mean Plasma Glucose: 438.33 mg/dL

## 2018-01-07 NOTE — Discharge Summary (Signed)
Patient left AMA please see note from earlier that day.  New diagnosis of DM-- HgbA1c still pending  Marlin Canary DO

## 2018-08-13 ENCOUNTER — Encounter (HOSPITAL_COMMUNITY): Payer: Self-pay | Admitting: Emergency Medicine

## 2018-08-13 ENCOUNTER — Other Ambulatory Visit: Payer: Self-pay

## 2018-08-13 ENCOUNTER — Ambulatory Visit (HOSPITAL_COMMUNITY)
Admission: EM | Admit: 2018-08-13 | Discharge: 2018-08-13 | Disposition: A | Discharge status: Home / Self Care | Payer: Self-pay | Attending: Family Medicine | Admitting: Family Medicine

## 2018-08-13 DIAGNOSIS — T63441A Toxic effect of venom of bees, accidental (unintentional), initial encounter: Secondary | ICD-10-CM

## 2018-08-13 NOTE — ED Triage Notes (Signed)
Pt here after being stung by wasp; pt denies any sx of allergic reaction

## 2018-08-13 NOTE — ED Provider Notes (Signed)
[place]    CSN: [id number] Arrival date & time: 08/13/18  1657     History   Chief Complaint Chief Complaint  Patient presents with  . Insect Bite    HPI Dustin Giles is a 33 y.o. male.   HPI  This patient got into his car and leaned back he felt to stings on his right low back.  When he got out of the car and shook out assured he saw a wasp fall to the ground.  He is here for evaluation.  To the best of his memory he has never been stung before.  He is worried about allergic reactions.  He states that the area stings and itches.  No difficulty breathing swelling of throat or change in voice noted.   Past Medical History:  Diagnosis Date  . New onset type 2 diabetes mellitus ([place]) 01/05/2018    Patient Active Problem List   Diagnosis Date Noted  . New onset type 2 diabetes mellitus ([place]) 01/06/2018    Past Surgical History:  Procedure Laterality Date  . KNEE SURGERY         Home Medications    Prior to Admission medications   Medication Sig Start Date End Date Taking? Authorizing Provider  Multiple Vitamin (MULTIVITAMIN WITH MINERALS) TABS tablet Take 1 tablet by mouth daily.    [provider]    Family History Family History  Problem Relation Age of Onset  . Hypertension Father     Social History Social History   Tobacco Use  . Smoking status: Never Smoker  . Smokeless tobacco: Never Used  Substance Use Topics  . Alcohol use: No    Frequency: Never  . Drug use: Not on file     Allergies   Patient has no known allergies.   Review of Systems Review of Systems  Constitutional: Negative for chills and fever.  HENT: Negative for ear pain and sore throat.   Eyes: Negative for pain and visual disturbance.  Respiratory: Negative for cough and shortness of breath.   Cardiovascular: Negative for chest pain and palpitations.  Gastrointestinal: Negative for abdominal pain and vomiting.  Genitourinary: Negative for dysuria  and hematuria.  Musculoskeletal: Negative for arthralgias and back pain.  Skin: Positive for wound. Negative for color change and rash.  Neurological: Negative for seizures and syncope.  All other systems reviewed and are negative.    Physical Exam Triage Vital Signs ED Triage Vitals [08/13/18 1721]  Enc Vitals Group     BP 132/87     Pulse Rate 94     Resp 18     Temp 98.6 F (37 C)     Temp Source Oral     SpO2 96 %     Weight      Height      Head Circumference      Peak Flow      Pain Score 3     Pain Loc      Pain Edu?      Excl. in [place]?    No data found.  Updated Vital Signs BP 132/87 (BP Location: Right Arm)   Pulse 94   Temp 98.6 F (37 C) (Oral)   Resp 18   SpO2 96%       Physical Exam Constitutional:      General: He is not in acute distress.    Appearance: He is well-developed.  HENT:     Head: Normocephalic and atraumatic.  Eyes:  Conjunctiva/sclera: Conjunctivae normal.     Pupils: Pupils are equal, round, and reactive to light.  Neck:     Musculoskeletal: Normal range of motion.  Cardiovascular:     Rate and Rhythm: Normal rate.  Pulmonary:     Effort: Pulmonary effort is normal. No respiratory distress.  Abdominal:     General: There is no distension.     Palpations: Abdomen is soft.  Musculoskeletal: Normal range of motion.  Skin:    General: Skin is warm and dry.     Comments: Right lumbar region has lesion and photo.  The welt is approximately 2 x 4 cm.  Surrounding erythema of another 1 to 2 cm.  No stinger in place.  Minimal tenderness  Neurological:     Mental Status: He is alert.        UC Treatments / Results  Labs (all labs ordered are listed, but only abnormal results are displayed) Labs Reviewed - No data to display  EKG   Radiology No results found.  Procedures Procedures (including critical care time)  Medications Ordered in UC Medications - No data to display  Initial Impression / Assessment and Plan  / UC Course  I have reviewed the triage vital signs and the nursing notes.  Pertinent labs & imaging results that were available during my care of the patient were reviewed by me and considered in my medical decision making (see chart for details).      Final Clinical Impressions(s) / UC Diagnoses   Final diagnoses:  Bee sting reaction, accidental or unintentional, initial encounter     Discharge Instructions     Put ice on area Take an antihistamine for the allergic reaction Benadryl may cause drowsiness - but works fastest Zyrtec or Claritin will work also but can take an hour Try not to worry!    ED Prescriptions    None     Controlled Substance Prescriptions [place] Controlled Substance Registry consulted? Not Applicable   Eustace MooreNelson, Norah Devin Sue, MD 08/13/18 [phone]

## 2018-08-13 NOTE — Discharge Instructions (Addendum)
Put ice on area Take an antihistamine for the allergic reaction Benadryl may cause drowsiness - but works fastest [employer] or Claritin will work also but can take an hour Try not to worry!

## 2018-09-01 ENCOUNTER — Other Ambulatory Visit: Payer: Self-pay

## 2018-09-01 ENCOUNTER — Emergency Department (HOSPITAL_COMMUNITY)
Admission: EM | Admit: 2018-09-01 | Discharge: 2018-09-01 | Disposition: A | Discharge status: Home / Self Care | Payer: Self-pay | Attending: Emergency Medicine | Admitting: Emergency Medicine

## 2018-09-01 DIAGNOSIS — Y929 Unspecified place or not applicable: Secondary | ICD-10-CM | POA: Insufficient documentation

## 2018-09-01 DIAGNOSIS — S51811A Laceration without foreign body of right forearm, initial encounter: Secondary | ICD-10-CM | POA: Insufficient documentation

## 2018-09-01 DIAGNOSIS — W25XXXA Contact with sharp glass, initial encounter: Secondary | ICD-10-CM | POA: Insufficient documentation

## 2018-09-01 DIAGNOSIS — W208XXA Other cause of strike by thrown, projected or falling object, initial encounter: Secondary | ICD-10-CM | POA: Insufficient documentation

## 2018-09-01 DIAGNOSIS — R739 Hyperglycemia, unspecified: Secondary | ICD-10-CM

## 2018-09-01 DIAGNOSIS — E1165 Type 2 diabetes mellitus with hyperglycemia: Secondary | ICD-10-CM | POA: Insufficient documentation

## 2018-09-01 DIAGNOSIS — Y999 Unspecified external cause status: Secondary | ICD-10-CM | POA: Insufficient documentation

## 2018-09-01 DIAGNOSIS — Y939 Activity, unspecified: Secondary | ICD-10-CM | POA: Insufficient documentation

## 2018-09-01 LAB — CBC
HCT: 44.2 % (ref 39.0–52.0)
Hemoglobin: 14.5 g/dL (ref 13.0–17.0)
MCH: 25.9 pg — ABNORMAL LOW (ref 26.0–34.0)
MCHC: 32.8 g/dL (ref 30.0–36.0)
MCV: 78.9 fL — ABNORMAL LOW (ref 80.0–100.0)
Platelets: 248 10*3/uL (ref 150–400)
RBC: 5.6 MIL/uL (ref 4.22–5.81)
RDW: 12.6 % (ref 11.5–15.5)
WBC: 7.3 10*3/uL (ref 4.0–10.5)
nRBC: 0.3 % — ABNORMAL HIGH (ref 0.0–0.2)

## 2018-09-01 LAB — CBG MONITORING, ED
Glucose-Capillary: 559 mg/dL (ref 70–99)
Glucose-Capillary: 330 mg/dL — ABNORMAL HIGH (ref 70–99)

## 2018-09-01 LAB — COMPREHENSIVE METABOLIC PANEL
ALT: 31 U/L (ref 0–44)
AST: 41 U/L (ref 15–41)
Albumin: 3.8 g/dL (ref 3.5–5.0)
Alkaline Phosphatase: 130 U/L — ABNORMAL HIGH (ref 38–126)
Anion gap: 16 — ABNORMAL HIGH (ref 5–15)
BUN: 9 mg/dL (ref 6–20)
CO2: 23 mmol/L (ref 22–32)
Calcium: 9.3 mg/dL (ref 8.9–10.3)
Chloride: 95 mmol/L — ABNORMAL LOW (ref 98–111)
Creatinine, Ser: 1.19 mg/dL (ref 0.61–1.24)
GFR calc Af Amer: >60 mL/min (ref >60)
GFR calc non Af Amer: >60 mL/min (ref >60)
Glucose, Bld: 433 mg/dL — ABNORMAL HIGH (ref 70–99)
Potassium: 4.6 mmol/L (ref 3.5–5.1)
Sodium: 134 mmol/L — ABNORMAL LOW (ref 135–145)
Total Bilirubin: 2.3 mg/dL — ABNORMAL HIGH (ref 0.3–1.2)
Total Protein: 6.5 g/dL (ref 6.5–8.1)

## 2018-09-01 MED ORDER — SODIUM CHLORIDE 0.9 % IV BOLUS
2000.0000 mL | Freq: Once | INTRAVENOUS | Status: AC
  Administered 2018-09-01: 07:00:00 2000 mL via INTRAVENOUS

## 2018-09-01 MED ORDER — LIDOCAINE-EPINEPHRINE (PF) 2 %-1:200000 IJ SOLN
10.0000 mL | Freq: Once | INTRAMUSCULAR | Status: DC
  Filled 2018-09-01: qty 20

## 2018-09-01 MED ORDER — METFORMIN HCL 500 MG PO TABS: ORAL_TABLET | ORAL | 0 refills | Status: DC

## 2018-09-01 MED ORDER — DOXYCYCLINE HYCLATE 100 MG PO CAPS: 100.0000 mg | ORAL_CAPSULE | Freq: Two times a day (BID) | ORAL | 0 refills | Status: AC

## 2018-09-01 NOTE — ED Provider Notes (Signed)
[facility] EMERGENCY DEPARTMENT Provider Note   CSN: [id number] Arrival date & time: 09/01/18  0147     History   Chief Complaint Chief Complaint  Patient presents with  . Extremity Laceration    HPI Dustin Giles is a 33 y.o. male with a history of diabetes mellitus type 2 who presents to the emergency department with a chief complaint of laceration.  The patient cut his right forearm on a piece of broken glass from a table earlier tonight.  No numbness or weakness.  Bleeding is controlled.  His tetanus was updated within the last year.  The patient reports that he was previously diagnosed with type 2 diabetes in 2019.  He cannot recall which medications he was previously taking, but reports that he stopped all of his medications because he wanted to treat his diabetes with diet and exercise.  He notes that he has been very thirsty and voiding frequently for some time.  He denies shortness of breath, confusion, abdominal pain, nausea, vomiting, or diarrhea.     The history is provided by the patient. No language interpreter was used.    Past Medical History:  Diagnosis Date  . New onset type 2 diabetes mellitus (HCC) 01/05/2018    Patient Active Problem List   Diagnosis Date Noted  . New onset type 2 diabetes mellitus (H[facility]) 01/06/2018    Past Surgical History:  Procedure Laterality Date  . KNEE SURGERY          Home Medications    Prior to Admission medications   Medication Sig Start Date End Date Taking? Authorizing Provider  Multiple Vitamin (MULTIVITAMIN WITH MINERALS) TABS tablet Take 1 tablet by mouth daily.   Yes [provider]  doxycycline (VIBRAMYCIN) 100 MG capsule Take 1 capsule (100 mg total) by mouth 2 (two) times daily for 7 days. 09/01/18 09/08/18  Kynlei Piontek A, PA-C  metFORMIN (GLUCOPHAGE) 500 MG tablet Take 1 tablet at bedtime for the first week.  After the first week, take 1 tablet in the morning by mouth and 1  tablet at bedtime daily. 09/01/18   Normal Recinos A, PA-C    Family History Family History  Problem Relation Age of Onset  . Hypertension Father     Social History Social History   Tobacco Use  . Smoking status: Never Smoker  . Smokeless tobacco: Never Used  Substance Use Topics  . Alcohol use: No    Frequency: Never  . Drug use: Not on file     Allergies   Patient has no known allergies.   Review of Systems Review of Systems  Constitutional: Negative for activity change, chills and fever.  Respiratory: Negative for shortness of breath.   Cardiovascular: Negative for chest pain.  Gastrointestinal: Negative for abdominal pain, nausea and vomiting.  Musculoskeletal: Negative for back pain.  Skin: Positive for wound. Negative for rash.  Neurological: Negative for weakness and numbness.   Physical Exam Updated Vital Signs BP 132/84   Pulse 79   Temp 97.9 F (36.6 C) (Oral)   Resp 18   SpO2 96%   Physical Exam Vitals signs and nursing note reviewed.  Constitutional:      Appearance: He is well-developed.  HENT:     Head: Normocephalic.  Eyes:     Conjunctiva/sclera: Conjunctivae normal.  Neck:     Musculoskeletal: Neck supple.  Cardiovascular:     Rate and Rhythm: Normal rate and regular rhythm.     Heart sounds: No  murmur.  Pulmonary:     Effort: Pulmonary effort is normal.  Abdominal:     General: There is no distension.     Palpations: Abdomen is soft.  Musculoskeletal:     Comments: Full active and passive range of motion of the right wrist and elbow.  Radial pulses are 2+ and symmetric.  Sensation is intact and equal.  Good capillary refill of the digits of the right hand.  5 5 strength against resistance.  Skin:    General: Skin is warm and dry.     Comments: There is a 1.5 cm elliptical, superficial, hemostatic laceration to the right forearm.  Neurological:     Mental Status: He is alert.  Psychiatric:        Behavior: Behavior normal.       ED Treatments / Results  Labs (all labs ordered are listed, but only abnormal results are displayed) Labs Reviewed  CBC - Abnormal; Notable for the following components:      Result Value   MCV 78.9 (*)    MCH 25.9 (*)    nRBC 0.3 (*)    All other components within normal limits  CBG MONITORING, ED - Abnormal; Notable for the following components:   Glucose-Capillary 559 (*)    All other components within normal limits  COMPREHENSIVE METABOLIC PANEL  CBG MONITORING, ED    EKG None  Radiology No results found.  Procedures .Marland Kitchen.Laceration Repair  Date/Time: 09/01/2018 6:23 AM Performed by: Barkley BoardsMcDonald, Cruise Baumgardner A, PA-C Authorized by: Barkley BoardsMcDonald, Damontay Alred A, PA-C   Consent:    Consent obtained:  Verbal   Consent given by:  Patient   Risks discussed:  Infection, pain, poor cosmetic result and poor wound healing Anesthesia (see MAR for exact dosages):    Anesthesia method:  Local infiltration   Local anesthetic:  Lidocaine 2% WITH epi Laceration details:    Location:  Shoulder/arm   Shoulder/arm location:  R lower arm   Length (cm):  1.5 Repair type:    Repair type:  Simple Pre-procedure details:    Preparation:  Patient was prepped and draped in usual sterile fashion Exploration:    Hemostasis achieved with:  Direct pressure   Wound exploration: wound explored through full range of motion and entire depth of wound probed and visualized     Wound extent: no areolar tissue violation noted, no fascia violation noted, no foreign bodies/material noted, no muscle damage noted, no nerve damage noted, no tendon damage noted, no underlying fracture noted and no vascular damage noted     Contaminated: no   Treatment:    Area cleansed with:  Shur-Clens   Amount of cleaning:  Standard   Visualized foreign bodies/material removed: no   Skin repair:    Repair method:  Sutures   Suture size:  4-0   Suture material:  Prolene Approximation:    Approximation:  Close Post-procedure details:     Dressing:  Antibiotic ointment and sterile dressing   Patient tolerance of procedure:  Tolerated well, no immediate complications   (including critical care time)  Medications Ordered in ED Medications  lidocaine-EPINEPHrine (XYLOCAINE W/EPI) 2 %-1:200000 (PF) injection 10 mL (has no administration in time range)  sodium chloride 0.9 % bolus 2,000 mL (0 mLs Intravenous Stopped 09/01/18 0720)     Initial Impression / Assessment and Plan / ED Course  I have reviewed the triage vital signs and the nursing notes.  Pertinent labs & imaging results that were available during my care of  the patient were reviewed by me and considered in my medical decision making (see chart for details).        33 year old male with a history of diabetes mellitus type 2 who presents to the emergency department with a superficial right forearm laceration.  Pressure irrigation performed. Wound explored and base of wound visualized in a bloodless field without evidence of foreign body.  Laceration occurred < 8 hours prior to repair which was well tolerated. Tdap is update.     Patient was previously diagnosed as a type II diabetic, but has stopped all of his medications.  He endorses polyuria and polydipsia.  Given concern for wound healing, CBG was checked and was found to be 559.  Will order basic labs and IV fluid bolus x2 and plan to recheck CBG.  Will plan to discharge the patient with antibiotics and suture removal instructions in 7 days.  Patient care transferred to [place] at the end of my shift. Patient presentation, ED course, and plan of care discussed with review of all pertinent labs and imaging. Please see his/her note for further details regarding further ED course and disposition.   Final Clinical Impressions(s) / ED Diagnoses   Final diagnoses:  Forearm laceration, right, initial encounter  Hyperglycemia    ED Discharge Orders         Ordered    doxycycline (VIBRAMYCIN) 100 MG capsule   2 times daily     09/01/18 0639    metFORMIN (GLUCOPHAGE) 500 MG tablet     09/01/18 0639           Joline Maxcy A, PA-C 77/41/28 7867    Delora Fuel, MD 67/20/94 [phone]

## 2018-09-01 NOTE — ED Triage Notes (Signed)
Per pt he dropped a glass table on his arm and it cut it on his right forearm. Pt does have a laceration to the right arm that is controlled bleeding.

## 2018-09-01 NOTE — TOC Transition Note (Signed)
Transition of Care [facility]) - CM/SW Discharge Note   Patient Details  Name: Vic Esco MRN: [id number] Date of Birth: [birth year]  [facility] CM/SW Contact:  Fuller Mandril, RN Phone Number: 09/01/2018, 9:33 AM   Clinical Narrative:    Jason Coop. Clydene Laming, RN, BSN, [place] [phone]  [facility] set up appointment with [place] on 9/10 @ 0930.  Spoke with pt at bedside and advised to please arrive 15 min early and take a picture ID and your current medications.  Pt verbalizes understanding of keeping appointment.    Final next level of care: Home/Self Care Barriers to Discharge: Barriers Resolved   Patient Goals and CMS Choice Patient states their goals for this hospitalization and ongoing recovery are:: get a primary doctor      Discharge Placement                       Discharge Plan and Services   Discharge Planning Services: CM Consult, Follow-up appt scheduled                                 Social Determinants of Health (SDOH) Interventions     Readmission Risk Interventions No flowsheet data found.

## 2018-09-01 NOTE — TOC Progression Note (Signed)
Transition of Care [facility]) - Progression Note    Patient Details  Name: Dustin Giles MRN: [id number] Date of Birth: 05-30-1985  Transition of Care [facility]) CM/SW Contact  Fuller Mandril, RN Phone Number: 09/01/2018, 9:30 AM  Clinical Narrative:    [facility] met with pt at bedside. States he has not been able to secured PCP related to being uninsured.    Expected Discharge Plan: Home/Self Care Barriers to Discharge: Other (comment)(PCP appt- MD office opens at 8:30)  Expected Discharge Plan and Services Expected Discharge Plan: Home/Self Care   Discharge Planning Services: CM Consult, Follow-up appt scheduled   Living arrangements for the past 2 months: Apartment Expected Discharge Date: 09/01/18                                     Social Determinants of Health (SDOH) Interventions    Readmission Risk Interventions No flowsheet data found.

## 2018-09-01 NOTE — TOC Initial Note (Addendum)
Transition of Care [facility]) - Initial/Assessment Note    Patient Details  Name: Dustin Giles MRN: [id number] Date of Birth: 1985-04-21  Transition of Care [facility]) CM/SW Contact:    Fuller Mandril, RN Phone Number: 09/01/2018, 9:29 AM  Clinical Narrative:                 [facility] consulted regarding uninsured pt needing PCP establishment.  Expected Discharge Plan: Home/Self Care Barriers to Discharge: Other (comment)(PCP appt- MD office opens at 8:30)   Patient Goals and CMS Choice Patient states their goals for this hospitalization and ongoing recovery are:: get a primary doctor      Expected Discharge Plan and Services Expected Discharge Plan: Home/Self Care   Discharge Planning Services: CM Consult, Follow-up appt scheduled   Living arrangements for the past 2 months: Apartment Expected Discharge Date: 09/01/18                                    Prior Living Arrangements/Services Living arrangements for the past 2 months: Apartment Lives with:: Spouse Patient language and need for interpreter reviewed:: Yes Do you feel safe going back to the place where you live?: Yes      Need for Family Participation in Patient Care: Yes (Comment) Care giver support system in place?: Yes (comment)   Criminal Activity/Legal Involvement Pertinent to Current Situation/Hospitalization: No - Comment as needed  Activities of Daily Living      Permission Sought/Granted                  Emotional Assessment Appearance:: Appears stated age Attitude/Demeanor/Rapport: Engaged, Self-Confident, Gracious Affect (typically observed): Appropriate, Accepting, Pleasant Orientation: : Oriented to Self, Oriented to Place, Oriented to  Time, Oriented to Situation Alcohol / Substance Use: Not Applicable Psych Involvement: No (comment)  Admission diagnosis:  [place] Patient Active Problem List   Diagnosis Date Noted  . New onset type 2 diabetes mellitus ([place]) 01/06/2018   PCP:   Patient, No Pcp Per Pharmacy:   [facility] ([place] [place] - [address] [place] [address] Phone: [phone] Fax: [phone]     Social Determinants of Health (SDOH) Interventions    Readmission Risk Interventions No flowsheet data found.

## 2018-09-01 NOTE — Discharge Instructions (Addendum)
Thank you for allowing me to care for you today in the Emergency Department.   Your sutures need to be removed in 7 days.  You can have them removed at primary care, at urgent care, or by returning to the ER.  To care for your wound at home, clean the area at least once daily with warm water and soap.  Then, apply a topical antibiotic such as bacitracin or Neosporin to the area.  Keep the wound covered with a bandage until your sutures are removed.  Since your blood sugars have been running high, you need to take an antibiotic to prevent infection.  Take 1 tablet of doxycycline by mouth 2 times daily for the next 7 days.  You need to have your blood sugar rechecked within the next 1 to 2 weeks.  You will also need to have your A1c checked.  Follow-up with primary care. Take 1 tablet of metformin by mouth at night daily for 1 week.  Then, take 1 tablet in the morning by mouth and 1 tablet at night by mouth going forward.  You should return for a wound check if you develop fever, chills, thick mucus-like drainage from the wound, if the wound becomes red, hot to the touch, or swollen.

## 2018-09-12 ENCOUNTER — Encounter (HOSPITAL_COMMUNITY): Payer: Self-pay

## 2018-09-12 ENCOUNTER — Ambulatory Visit (HOSPITAL_COMMUNITY)
Admission: EM | Admit: 2018-09-12 | Discharge: 2018-09-12 | Disposition: A | Discharge status: Home / Self Care | Payer: Self-pay | Attending: Family Medicine | Admitting: Family Medicine

## 2018-09-12 ENCOUNTER — Other Ambulatory Visit: Payer: Self-pay

## 2018-09-12 DIAGNOSIS — Z4802 Encounter for removal of sutures: Secondary | ICD-10-CM

## 2018-09-12 NOTE — ED Triage Notes (Signed)
Three sutures removed from patient's right forearm. Wound healing well, no bleeding.

## 2018-09-16 ENCOUNTER — Encounter (INDEPENDENT_AMBULATORY_CARE_PROVIDER_SITE_OTHER): Payer: Self-pay | Admitting: Primary Care

## 2018-09-16 ENCOUNTER — Other Ambulatory Visit: Payer: Self-pay

## 2018-09-16 ENCOUNTER — Ambulatory Visit (INDEPENDENT_AMBULATORY_CARE_PROVIDER_SITE_OTHER): Payer: Self-pay | Admitting: Primary Care

## 2018-09-16 VITALS — BP 127/82 | HR 69 | Temp 97.6°F | Ht 71.5 in | Wt 220.4 lb

## 2018-09-16 DIAGNOSIS — E119 Type 2 diabetes mellitus without complications: Secondary | ICD-10-CM

## 2018-09-16 DIAGNOSIS — R739 Hyperglycemia, unspecified: Secondary | ICD-10-CM

## 2018-09-16 DIAGNOSIS — Z114 Encounter for screening for human immunodeficiency virus [HIV]: Secondary | ICD-10-CM

## 2018-09-16 DIAGNOSIS — Z7689 Persons encountering health services in other specified circumstances: Secondary | ICD-10-CM

## 2018-09-16 LAB — POCT URINALYSIS DIPSTICK
Bilirubin, UA: NEGATIVE
Blood, UA: NEGATIVE
Glucose, UA: POSITIVE — AB
Ketones, UA: POSITIVE
Leukocytes, UA: NEGATIVE
Nitrite, UA: NEGATIVE
Protein, UA: NEGATIVE
Spec Grav, UA: <=1.005 — AB (ref 1.010–1.025)
Urobilinogen, UA: 0.2 E.U./dL
pH, UA: 5.5 (ref 5.0–8.0)

## 2018-09-16 MED ORDER — METFORMIN HCL 1000 MG PO TABS: ORAL_TABLET | ORAL | 3 refills | Status: AC

## 2018-09-16 MED ORDER — GLIPIZIDE 10 MG PO TABS: 10.0000 mg | ORAL_TABLET | Freq: Two times a day (BID) | ORAL | 3 refills | Status: AC

## 2018-09-16 MED ORDER — INSULIN ASPART 100 UNIT/ML ~~LOC~~ SOLN
20.0000 [IU] | Freq: Once | SUBCUTANEOUS | Status: AC
  Administered 2018-09-16: 14:00:00 20 [IU] via SUBCUTANEOUS

## 2018-09-16 MED ORDER — BLOOD GLUCOSE METER KIT: PACK | 0 refills | Status: AC

## 2018-09-16 MED FILL — glipiZIDE 10 MG TABS: 10 | 30 days supply | Qty: 60 | Fill #0

## 2018-09-16 MED FILL — metFORMIN HCL 1000 MG TABS: 1000 | 30 days supply | Qty: 60 | Fill #0

## 2018-09-16 NOTE — Progress Notes (Signed)
220.4 lbs 5'11.5"

## 2018-09-16 NOTE — Progress Notes (Signed)
New Patient Office Visit  Subjective:  Patient ID: Dustin Giles, male    DOB: [birth year]  Age: 33 y.o. MRN: [id number]  CC: No chief complaint on file.   HPI Dustin Giles presents for to establish care and treatment of newly diagnosis Type 2 diabetes 12/2017. Patient has disregard diagnosis and has not been taking care of his self other than exercising. Extensive teaching was done and what made him concern what the inability to have an erection and dialysis   Past Medical History:  Diagnosis Date  . New onset type 2 diabetes mellitus ([place]) 01/05/2018    Past Surgical History:  Procedure Laterality Date  . KNEE SURGERY      Family History  Problem Relation Age of Onset  . Hypertension Father   . Healthy Mother     Social History   Socioeconomic History  . Marital status: Single    Spouse name: Not on file  . Number of children: Not on file  . Years of education: Not on file  . Highest education level: Not on file  Occupational History  . Not on file  Social Needs  . Financial resource strain: Not on file  . Food insecurity    Worry: Not on file    Inability: Not on file  . Transportation needs    Medical: Not on file    Non-medical: Not on file  Tobacco Use  . Smoking status: Never Smoker  . Smokeless tobacco: Never Used  Substance and Sexual Activity  . Alcohol use: No    Frequency: Never  . Drug use: Not on file  . Sexual activity: Not on file  Lifestyle  . Physical activity    Days per week: Not on file    Minutes per session: Not on file  . Stress: Not on file  Relationships  . Social Herbalist on phone: Not on file    Gets together: Not on file    Attends religious service: Not on file    Active member of club or organization: Not on file    Attends meetings of clubs or organizations: Not on file    Relationship status: Not on file  . Intimate partner violence    Fear of current or ex partner: Not on file    Emotionally  abused: Not on file    Physically abused: Not on file    Forced sexual activity: Not on file  Other Topics Concern  . Not on file  Social History Narrative  . Not on file    ROS Review of Systems  Endocrine: Positive for polydipsia, polyphagia and polyuria.  All other systems reviewed and are negative.   Objective:   Today's Vitals: BP 127/82 (BP Location: Right Arm, Patient Position: Sitting, Cuff Size: Large)   Pulse 69   Temp 97.6 F (36.4 C) (Tympanic)   Ht 5' 11.5" (1.816 m)   Wt 220 lb 6.4 oz (100 kg)   SpO2 93%   BMI 30.31 kg/m   Physical Exam Vitals signs reviewed.  Constitutional:      Appearance: Normal appearance.  HENT:     Head: Normocephalic.     Right Ear: Tympanic membrane, ear canal and external ear normal.     Left Ear: Tympanic membrane, ear canal and external ear normal.  Neck:     Musculoskeletal: Normal range of motion and neck supple.  Cardiovascular:     Rate and Rhythm: Normal rate and regular rhythm.  Pulses: Normal pulses.     Heart sounds: Normal heart sounds.  Pulmonary:     Effort: Pulmonary effort is normal.     Breath sounds: Normal breath sounds.  Abdominal:     General: Abdomen is flat. Bowel sounds are normal.     Palpations: Abdomen is soft.  Musculoskeletal: Normal range of motion.  Skin:    General: Skin is warm and dry.  Neurological:     Mental Status: He is alert and oriented to person, place, and time.  Psychiatric:        Mood and Affect: Mood normal.        Behavior: Behavior normal.     Assessment & Plan:   Problem List Items Addressed This Visit    None      Outpatient Encounter Medications as of 09/16/2018  Medication Sig  . Multiple Vitamin (MULTIVITAMIN WITH MINERALS) TABS tablet Take 1 tablet by mouth daily.  . metFORMIN (GLUCOPHAGE) 500 MG tablet Take 1 tablet at bedtime for the first week.  After the first week, take 1 tablet in the morning by mouth and 1 tablet at bedtime daily.   No  facility-administered encounter medications on file as of 09/16/2018.    Diagnoses and all orders for this visit:  Encounter to establish care Dustin Mire, NP-C will be your  (PCP) that will  provides both the first contact for a person with an undiagnosed health concern as well as continuing care of varied medical conditions, not limited by cause, organ system, or diagnosis.   Type 2 diabetes mellitus without complication, without long-term current use of insulin (HCC) Unable to obtained CBG treated with 20 units of insulin and had him to drink 48 oz of water. Advised to go to emergency refused had him to sign a AMA and return tomorrow -     Glucose (CBG) -     HgB A1c -     Microalbumin/Creatinine Ratio, Urine -     Urinalysis Dipstick -     insulin aspart (novoLOG) injection 20 Units -     CBC with Differential -     CMP14+EGFR -     Lipid Panel -     Ambulatory referral to Ophthalmology -     Hemoglobin A1c  Hyperglycemia ADA recommends the following therapeutic goals for glycemic control related to A1c measurements: Goal of therapy: Less than 6.5 hemoglobin A1c.  Reference clinical practice recommendations. Foods that are high in carbohydrates are the following rice, potatoes, breads, sugars, and pastas.  Reduction in the intake (eating) will assist in lowering your blood sugars. -     insulin aspart (novoLOG) injection 20 Units -     CBC with Differential -     CMP14+EGFR -     Lipid Panel  Encounter for diabetic foot exam ([place]) Sensory exam of the foot is normal, tested with the monofilament. Good pulses, no lesions or ulcers, good peripheral pulses.  Screening for HIV (human immunodeficiency virus) -     HIV Antibody (routine testing w rflx)  Other orders -     blood glucose meter kit and supplies; Dispense based on patient and insurance preference. Use up to four times daily as directed. (FOR ICD-10 E10.9, E11.9). -     metFORMIN (GLUCOPHAGE) 1000 MG tablet; Take 1  tablet at bedtime for the first week.  After the first week, take 1 tablet in the morning by mouth and 1 tablet at bedtime daily. -  glipiZIDE (GLUCOTROL) 10 MG tablet; Take 1 tablet (10 mg total) by mouth 2 (two) times daily before a meal.   Follow-up: No follow-ups on file.   Kerin Perna, NP

## 2018-09-17 ENCOUNTER — Encounter (INDEPENDENT_AMBULATORY_CARE_PROVIDER_SITE_OTHER): Payer: Self-pay | Admitting: Primary Care

## 2018-09-17 ENCOUNTER — Ambulatory Visit (INDEPENDENT_AMBULATORY_CARE_PROVIDER_SITE_OTHER): Payer: Self-pay | Admitting: Primary Care

## 2018-09-17 VITALS — Ht 71.5 in | Wt 220.0 lb

## 2018-09-17 DIAGNOSIS — E119 Type 2 diabetes mellitus without complications: Secondary | ICD-10-CM

## 2018-09-17 DIAGNOSIS — E782 Mixed hyperlipidemia: Secondary | ICD-10-CM

## 2018-09-17 LAB — CMP14+EGFR
ALT: 30 IU/L (ref 0–44)
AST: 23 IU/L (ref 0–40)
Albumin/Globulin Ratio: 2.2 (ref 1.2–2.2)
Albumin: 4.3 g/dL (ref 4.0–5.0)
Alkaline Phosphatase: 189 IU/L — ABNORMAL HIGH (ref 39–117)
BUN/Creatinine Ratio: 8 — ABNORMAL LOW (ref 9–20)
BUN: 10 mg/dL (ref 6–20)
Bilirubin Total: 0.4 mg/dL (ref 0.0–1.2)
CO2: 25 mmol/L (ref 20–29)
Calcium: 9.4 mg/dL (ref 8.7–10.2)
Chloride: 87 mmol/L — ABNORMAL LOW (ref 96–106)
Creatinine, Ser: 1.28 mg/dL — ABNORMAL HIGH (ref 0.76–1.27)
GFR calc Af Amer: 84 mL/min/{1.73_m2} (ref >59)
GFR calc non Af Amer: 73 mL/min/{1.73_m2} (ref >59)
Globulin, Total: 2 g/dL (ref 1.5–4.5)
Glucose: 719 mg/dL (ref 65–99)
Potassium: 4.9 mmol/L (ref 3.5–5.2)
Sodium: 126 mmol/L — ABNORMAL LOW (ref 134–144)
Total Protein: 6.3 g/dL (ref 6.0–8.5)

## 2018-09-17 LAB — CBC WITH DIFFERENTIAL/PLATELET
Basophils Absolute: 0 10*3/uL (ref 0.0–0.2)
Basos: 0 %
EOS (ABSOLUTE): 0.1 10*3/uL (ref 0.0–0.4)
Eos: 1 %
Hematocrit: 43.3 % (ref 37.5–51.0)
Hemoglobin: 13.8 g/dL (ref 13.0–17.7)
Immature Grans (Abs): 0.1 10*3/uL (ref 0.0–0.1)
Immature Granulocytes: 1 %
Lymphocytes Absolute: 1.9 10*3/uL (ref 0.7–3.1)
Lymphs: 39 %
MCH: 26.1 pg — ABNORMAL LOW (ref 26.6–33.0)
MCHC: 31.9 g/dL (ref 31.5–35.7)
MCV: 82 fL (ref 79–97)
Monocytes Absolute: 0.4 10*3/uL (ref 0.1–0.9)
Monocytes: 8 %
Neutrophils Absolute: 2.5 10*3/uL (ref 1.4–7.0)
Neutrophils: 51 %
Platelets: 229 10*3/uL (ref 150–450)
RBC: 5.28 x10E6/uL (ref 4.14–5.80)
RDW: 12.5 % (ref 11.6–15.4)
WBC: 4.8 10*3/uL (ref 3.4–10.8)

## 2018-09-17 LAB — MICROALBUMIN / CREATININE URINE RATIO
Creatinine, Urine: 12.1 mg/dL
Microalb/Creat Ratio: <25 mg/g creat (ref 0–29)
Microalbumin, Urine: <3 ug/mL

## 2018-09-17 LAB — HEMOGLOBIN A1C
Est. average glucose Bld gHb Est-mCnc: >398 mg/dL
Hgb A1c MFr Bld: >15.5 % — ABNORMAL HIGH (ref 4.8–5.6)

## 2018-09-17 LAB — LIPID PANEL
Chol/HDL Ratio: 6.9 ratio — ABNORMAL HIGH (ref 0.0–5.0)
Cholesterol, Total: 282 mg/dL — ABNORMAL HIGH (ref 100–199)
HDL: 41 mg/dL (ref >39)
LDL Chol Calc (NIH): 165 mg/dL — ABNORMAL HIGH (ref 0–99)
Triglycerides: 394 mg/dL — ABNORMAL HIGH (ref 0–149)
VLDL Cholesterol Cal: 76 mg/dL — ABNORMAL HIGH (ref 5–40)

## 2018-09-17 LAB — HIV ANTIBODY (ROUTINE TESTING W REFLEX): HIV Screen 4th Generation wRfx: NONREACTIVE

## 2018-09-17 LAB — GLUCOSE, POCT (MANUAL RESULT ENTRY): POC Glucose: 368 mg/dl — AB (ref 70–99)

## 2018-09-17 MED ORDER — ATORVASTATIN CALCIUM 40 MG PO TABS: 40.0000 mg | ORAL_TABLET | Freq: Every day | ORAL | 3 refills | Status: AC

## 2018-09-17 MED ORDER — INSULIN ASPART 100 UNIT/ML ~~LOC~~ SOLN
15.0000 [IU] | Freq: Once | SUBCUTANEOUS | Status: AC
  Administered 2018-09-17: 11:00:00 15 [IU] via SUBCUTANEOUS

## 2018-09-17 NOTE — Progress Notes (Signed)
Acute Office Visit  Subjective:    Patient ID: Dustin Giles, male    DOB: [birth year], 33 y.o.   MRN: [id number]  No chief complaint on file.   HPI Patient is in today for follow up on his diabetes yesterday instruted him to go to ED because the meter would not read blood sugar he was covered with 20 units. Results today were CBG 716 from labs drawn yesterday. CBG today 368.  Past Medical History:  Diagnosis Date  . New onset type 2 diabetes mellitus ([place]) 01/05/2018    Past Surgical History:  Procedure Laterality Date  . KNEE SURGERY      Family History  Problem Relation Age of Onset  . Hypertension Father   . Healthy Mother     Social History   Socioeconomic History  . Marital status: Single    Spouse name: Not on file  . Number of children: Not on file  . Years of education: Not on file  . Highest education level: Not on file  Occupational History  . Not on file  Social Needs  . Financial resource strain: Not on file  . Food insecurity    Worry: Not on file    Inability: Not on file  . Transportation needs    Medical: Not on file    Non-medical: Not on file  Tobacco Use  . Smoking status: Never Smoker  . Smokeless tobacco: Never Used  Substance and Sexual Activity  . Alcohol use: No    Frequency: Never  . Drug use: Not on file  . Sexual activity: Not on file  Lifestyle  . Physical activity    Days per week: Not on file    Minutes per session: Not on file  . Stress: Not on file  Relationships  . Social Herbalist on phone: Not on file    Gets together: Not on file    Attends religious service: Not on file    Active member of club or organization: Not on file    Attends meetings of clubs or organizations: Not on file    Relationship status: Not on file  . Intimate partner violence    Fear of current or ex partner: Not on file    Emotionally abused: Not on file    Physically abused: Not on file    Forced sexual activity: Not on  file  Other Topics Concern  . Not on file  Social History Narrative  . Not on file    Outpatient Medications Prior to Visit  Medication Sig Dispense Refill  . blood glucose meter kit and supplies Dispense based on patient and insurance preference. Use up to four times daily as directed. (FOR ICD-10 E10.9, E11.9). 1 each 0  . glipiZIDE (GLUCOTROL) 10 MG tablet Take 1 tablet (10 mg total) by mouth 2 (two) times daily before a meal. 60 tablet 3  . metFORMIN (GLUCOPHAGE) 1000 MG tablet Take 1 tablet at bedtime for the first week.  After the first week, take 1 tablet in the morning by mouth and 1 tablet at bedtime daily. 60 tablet 3  . Multiple Vitamin (MULTIVITAMIN WITH MINERALS) TABS tablet Take 1 tablet by mouth daily.     No facility-administered medications prior to visit.     No Known Allergies  Review of Systems  All other systems reviewed and are negative.      Objective:    Physical Exam  Constitutional: He is oriented to person, place,  and time. He appears well-developed and well-nourished.  Neck: Normal range of motion.  Abdominal: Soft. Bowel sounds are normal.  Musculoskeletal: Normal range of motion.  Neurological: He is alert and oriented to person, place, and time.  Skin: Skin is warm and dry.  Psychiatric: He has a normal mood and affect.    There were no vitals taken for this visit. Wt Readings from Last 3 Encounters:  09/16/18 220 lb 6.4 oz (100 kg)  01/06/18 244 lb 4.3 oz (110.8 kg)    Health Maintenance Due  Topic Date Due  . PNEUMOCOCCAL POLYSACCHARIDE VACCINE AGE 64-64 HIGH RISK  11/13/1986  . FOOT EXAM  11/13/1994  . OPHTHALMOLOGY EXAM  11/13/1994  . URINE MICROALBUMIN  11/13/1994  . HEMOGLOBIN A1C  07/07/2018    There are no preventive care reminders to display for this patient.   No results found for: TSH Lab Results  Component Value Date   WBC 4.8 09/16/2018   HGB 13.8 09/16/2018   HCT 43.3 09/16/2018   MCV 82 09/16/2018   PLT 229  09/16/2018   Lab Results  Component Value Date   NA 126 (L) 09/16/2018   K 4.9 09/16/2018   CO2 25 09/16/2018   GLUCOSE 719 (HH) 09/16/2018   BUN 10 09/16/2018   CREATININE 1.28 (H) 09/16/2018   BILITOT 0.4 09/16/2018   ALKPHOS 189 (H) 09/16/2018   AST 23 09/16/2018   ALT 30 09/16/2018   PROT 6.3 09/16/2018   ALBUMIN 4.3 09/16/2018   CALCIUM 9.4 09/16/2018   ANIONGAP 16 (H) 09/01/2018   Lab Results  Component Value Date   CHOL 282 (H) 09/16/2018   Lab Results  Component Value Date   HDL 41 09/16/2018   No results found for: [facility] Lab Results  Component Value Date   TRIG 394 (H) 09/16/2018   Lab Results  Component Value Date   CHOLHDL 6.9 (H) 09/16/2018   Lab Results  Component Value Date   HGBA1C 16.9 (H) 01/06/2018       Assessment & Plan:   Problem List Items Addressed This Visit    None       No orders of the defined types were placed in this encounter.    Kerin Perna, NP

## 2018-09-17 NOTE — Patient Instructions (Signed)
Recommendations For Diabetic/Prediabetic Patients:   -  Exercise at least 5 times a week for 30 minutes or preferably daily.  - No Smoking - Drink less than 2 drinks a day.  - Monitor your feet for sores - Have yearly Eye Exams - Recommend annual Flu vaccine  - Recommend Pneumovax and Prevnar vaccines - Shingles Vaccine (Zostavax) if over 34 y.o.  Goals:   - BMI less than 24 - Fasting sugar less than 130 or less than 150 if tapering medicines to lose weight  - Systolic BP less than 130  - Diastolic BP less than 80 - Bad LDL Cholesterol less than 70 - Triglycerides less than 150 

## 2018-09-29 ENCOUNTER — Ambulatory Visit: Payer: Self-pay | Attending: Family Medicine | Admitting: Pharmacist

## 2018-09-29 ENCOUNTER — Other Ambulatory Visit: Payer: Self-pay

## 2018-09-29 DIAGNOSIS — E119 Type 2 diabetes mellitus without complications: Secondary | ICD-10-CM

## 2018-09-29 MED FILL — glipiZIDE 10 MG TABS: 10 | 30 days supply | Qty: 60 | Fill #0

## 2018-09-29 NOTE — Progress Notes (Signed)
    S:    PCP: [name]   No chief complaint on file.  Patient arrives in good spirits.  Presents for diabetes evaluation, education, and management Patient was referred and last seen by Primary Care Provider on 09/17/2018.   Diabetes was diagnosed in 2019.   Family/Social History:  FHx: HTN (father) Tobacco: never smoker  Alcohol: denies   Insurance coverage/medication affordability: none  Patient denies adherence with medications.  Current diabetes medications include: glipizide 10 mg BID,  metformin 1000 mg BID Current hypertension medications include: none Current hyperlipidemia medications include: atorvastatin 40 mg daily  Patient denies hypoglycemic events.  Patient reported dietary habits: -non-adherent to a diab  Patient-reported exercise habits:  - patient exercises in the gym daily   Patient reports polyuria, polydipsia.  Patient denies neuropathy. Patient reports visual changes. Patient reports self foot exams.    O:  Lab Results  Component Value Date   HGBA1C >15.5 (H) 09/16/2018   There were no vitals filed for this visit.  Lipid Panel     Component Value Date/Time   CHOL 282 (H) 09/16/2018 1438   TRIG 394 (H) 09/16/2018 1438   HDL 41 09/16/2018 1438   CHOLHDL 6.9 (H) 09/16/2018 1438   Home fasting CBG:not checking   Clinical ASCVD: No  The ASCVD Risk score Mikey Bussing DC Jr., et al., 2013) failed to calculate for the following reasons:   The 2013 ASCVD risk score is only valid for ages 26 to 38   A/P: Diabetes relatively newly dx currently uncontrolled. Patient is able to verbalize appropriate hypoglycemia management plan. Patient is not adherent with medication. Control is suboptimal due to nonadherence and dietary indiscretion. -Continued current regimen.  -Will have him come back next week to discuss addition of insulin.  -Extensively discussed pathophysiology of DM, recommended lifestyle interventions, dietary effects on glycemic  control -Counseled on s/sx of and management of hypoglycemia -Next A1C anticipated 12/2018.   ASCVD risk - primary prevention in patient with DM. Last LDL is not controlled. ASCVD risk score cannot be calc d/t age.  -Continued atorvastatin 40 mg daily  HM: pt recently deferred flu. He is also due for PNA.  - Pt declined - Will follow-up at upcoming appt  Written patient instructions provided.  Total time in face to face counseling 30 minutes.   Follow up Pharmacist Clinic Visit in 1 week.     Patient seen with:  Dillard Essex PharmD Candidate  Class of 2022 [place], PharmD, [place] [phone]

## 2018-10-06 ENCOUNTER — Ambulatory Visit: Payer: Self-pay | Admitting: Pharmacist

## 2018-10-17 ENCOUNTER — Other Ambulatory Visit: Payer: Self-pay

## 2018-10-17 ENCOUNTER — Encounter (HOSPITAL_COMMUNITY): Payer: Self-pay | Admitting: Emergency Medicine

## 2018-10-17 ENCOUNTER — Ambulatory Visit (HOSPITAL_COMMUNITY)
Admission: EM | Admit: 2018-10-17 | Discharge: 2018-10-17 | Disposition: A | Discharge status: Home / Self Care | Payer: Self-pay | Attending: Family Medicine | Admitting: Family Medicine

## 2018-10-17 DIAGNOSIS — B309 Viral conjunctivitis, unspecified: Secondary | ICD-10-CM

## 2018-10-17 NOTE — Discharge Instructions (Addendum)
I believe this is most likely viral which means that this will resolve on its own with time.  It can take up to 2 weeks. You can keep using over-the-counter drops for relief You may try some allergy drops to see if this helps. This is highly contagious.  Make sure you are taking precautions and washing your hands Follow up as needed for continued or worsening symptoms

## 2018-10-17 NOTE — ED Provider Notes (Signed)
[place]    CSN: [id number] Arrival date & time: 10/17/18  1136      History   Chief Complaint Chief Complaint  Patient presents with  . Conjunctivitis    HPI Dustin Giles is a 33 y.o. male.   Fiance with same symptoms.    Conjunctivitis This is a new problem. The current episode started more than 2 days ago. The problem occurs constantly. The problem has not changed since onset.Pertinent negatives include no chest pain, no abdominal pain, no headaches and no shortness of breath. Nothing aggravates the symptoms. Relieved by: visine helps. The treatment provided mild relief.    Past Medical History:  Diagnosis Date  . New onset type 2 diabetes mellitus ([place]) 01/05/2018    Patient Active Problem List   Diagnosis Date Noted  . New onset type 2 diabetes mellitus ([place]) 01/06/2018    Past Surgical History:  Procedure Laterality Date  . KNEE SURGERY         Home Medications    Prior to Admission medications   Medication Sig Start Date End Date Taking? Authorizing Provider  atorvastatin (LIPITOR) 40 MG tablet Take 1 tablet (40 mg total) by mouth daily. 09/17/18   Kerin Perna, NP  blood glucose meter kit and supplies Dispense based on patient and insurance preference. Use up to four times daily as directed. (FOR ICD-10 E10.9, E11.9). 09/16/18   Kerin Perna, NP  glipiZIDE (GLUCOTROL) 10 MG tablet Take 1 tablet (10 mg total) by mouth 2 (two) times daily before a meal. 09/16/18   Kerin Perna, NP  metFORMIN (GLUCOPHAGE) 1000 MG tablet Take 1 tablet at bedtime for the first week.  After the first week, take 1 tablet in the morning by mouth and 1 tablet at bedtime daily. 09/16/18   Kerin Perna, NP  Multiple Vitamin (MULTIVITAMIN WITH MINERALS) TABS tablet Take 1 tablet by mouth daily.    [provider]    Family History Family History  Problem Relation Age of Onset  . Hypertension Father   . Healthy Mother      Social History Social History   Tobacco Use  . Smoking status: Never Smoker  . Smokeless tobacco: Never Used  Substance Use Topics  . Alcohol use: No    Frequency: Never  . Drug use: Not on file     Allergies   Patient has no known allergies.   Review of Systems Review of Systems  HENT: Negative for congestion, facial swelling, postnasal drip, rhinorrhea, sinus pressure, sinus pain, sneezing and sore throat.   Respiratory: Negative for shortness of breath.   Cardiovascular: Negative for chest pain.  Gastrointestinal: Negative for abdominal pain.  Neurological: Negative for headaches.     Physical Exam Triage Vital Signs ED Triage Vitals [10/17/18 1156]  Enc Vitals Group     BP 124/77     Pulse Rate 83     Resp 18     Temp 98.2 F (36.8 C)     Temp Source Temporal     SpO2 99 %     Weight      Height      Head Circumference      Peak Flow      Pain Score 2     Pain Loc      Pain Edu?      Excl. in [place]?    No data found.  Updated Vital Signs BP 124/77 (BP Location: Right Arm)   Pulse  83   Temp 98.2 F (36.8 C) (Temporal)   Resp 18   SpO2 99%   Visual Acuity Right Eye Distance:   Left Eye Distance:   Bilateral Distance:    Right Eye Near:   Left Eye Near:    Bilateral Near:     Physical Exam Vitals signs and nursing note reviewed.  Constitutional:      General: He is not in acute distress.    Appearance: Normal appearance. He is not ill-appearing, toxic-appearing or diaphoretic.  HENT:     Head: Normocephalic and atraumatic.     Nose: Nose normal. No congestion or rhinorrhea.  Eyes:     General:        Right eye: Discharge present.        Left eye: Discharge present.    Extraocular Movements: Extraocular movements intact.     Conjunctiva/sclera: Conjunctivae normal.     Pupils: Pupils are equal, round, and reactive to light.     Comments: Bilateral scleral injection.   Neck:     Musculoskeletal: Normal range of motion.  Pulmonary:      Effort: Pulmonary effort is normal.  Musculoskeletal: Normal range of motion.  Skin:    General: Skin is warm and dry.  Neurological:     Mental Status: He is alert.  Psychiatric:        Mood and Affect: Mood normal.      UC Treatments / Results  Labs (all labs ordered are listed, but only abnormal results are displayed) Labs Reviewed - No data to display  EKG   Radiology No results found.  Procedures Procedures (including critical care time)  Medications Ordered in UC Medications - No data to display  Initial Impression / Assessment and Plan / UC Course  I have reviewed the triage vital signs and the nursing notes.  Pertinent labs & imaging results that were available during my care of the patient were reviewed by me and considered in my medical decision making (see chart for details).     Viral conjunctivitis-self-limiting Precautions given He can continue the eyedrops if this helps. Follow up as needed for continued or worsening symptoms  Final Clinical Impressions(s) / UC Diagnoses   Final diagnoses:  Viral conjunctivitis of both eyes     Discharge Instructions     I believe this is most likely viral which means that this will resolve on its own with time.  It can take up to 2 weeks. You can keep using over-the-counter drops for relief You may try some allergy drops to see if this helps. This is highly contagious.  Make sure you are taking precautions and washing your hands Follow up as needed for continued or worsening symptoms     ED Prescriptions    None     PDMP not reviewed this encounter.   Orvan July, NP 10/17/18 1732

## 2018-10-17 NOTE — ED Triage Notes (Signed)
Pt here for bilateral eye redness and irritation; pt sts girlfriend has same sx

## 2018-10-18 ENCOUNTER — Ambulatory Visit: Payer: Self-pay | Admitting: Pharmacist

## 2018-10-25 ENCOUNTER — Ambulatory Visit: Payer: Self-pay | Admitting: Pharmacist

## 2019-09-03 IMAGING — DX DG WRIST COMPLETE 3+V*R*
4 series · 4 of 4 positions shown · non-contrast
Comparison: None.

CLINICAL DATA: Fall 6 days ago with pain.

EXAM:
RIGHT WRIST - COMPLETE 3+ VIEW

[wrist pa]
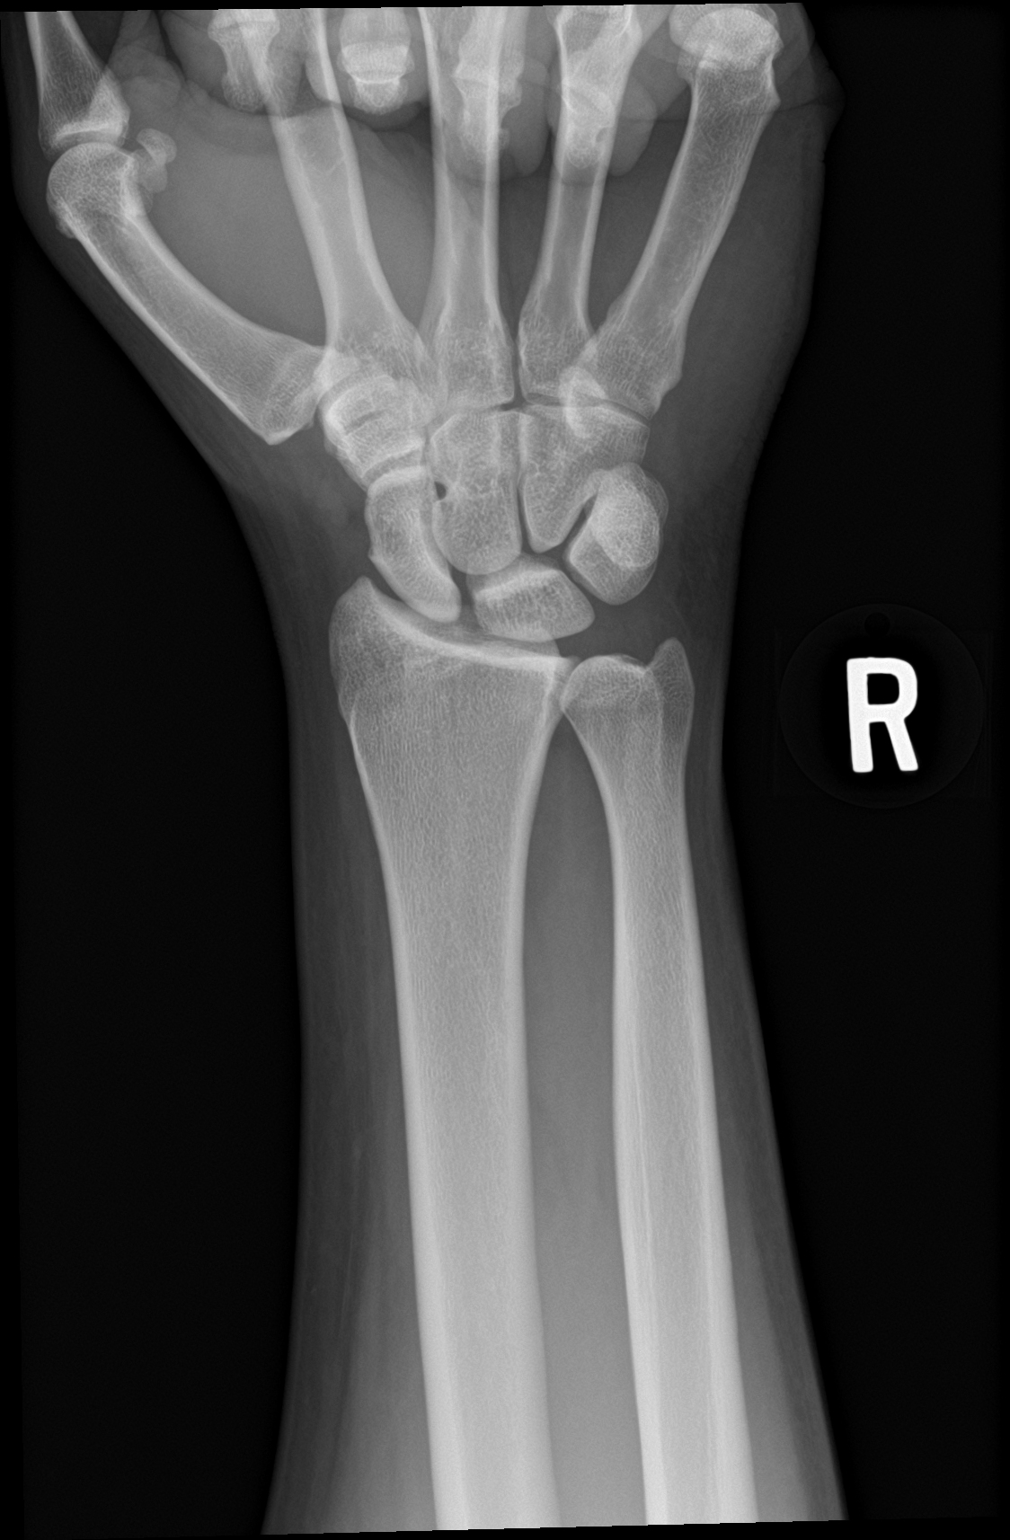

[wrist navicular]
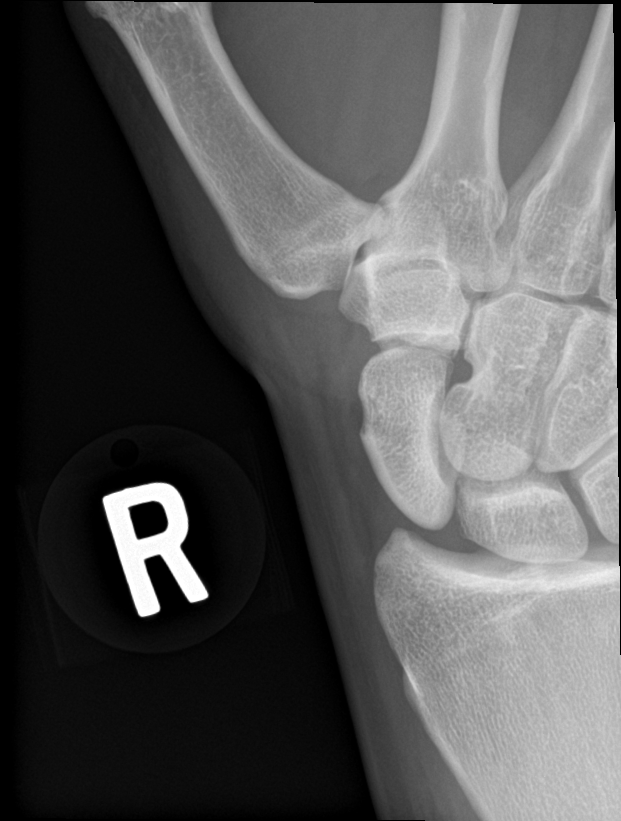

[wrist obl]
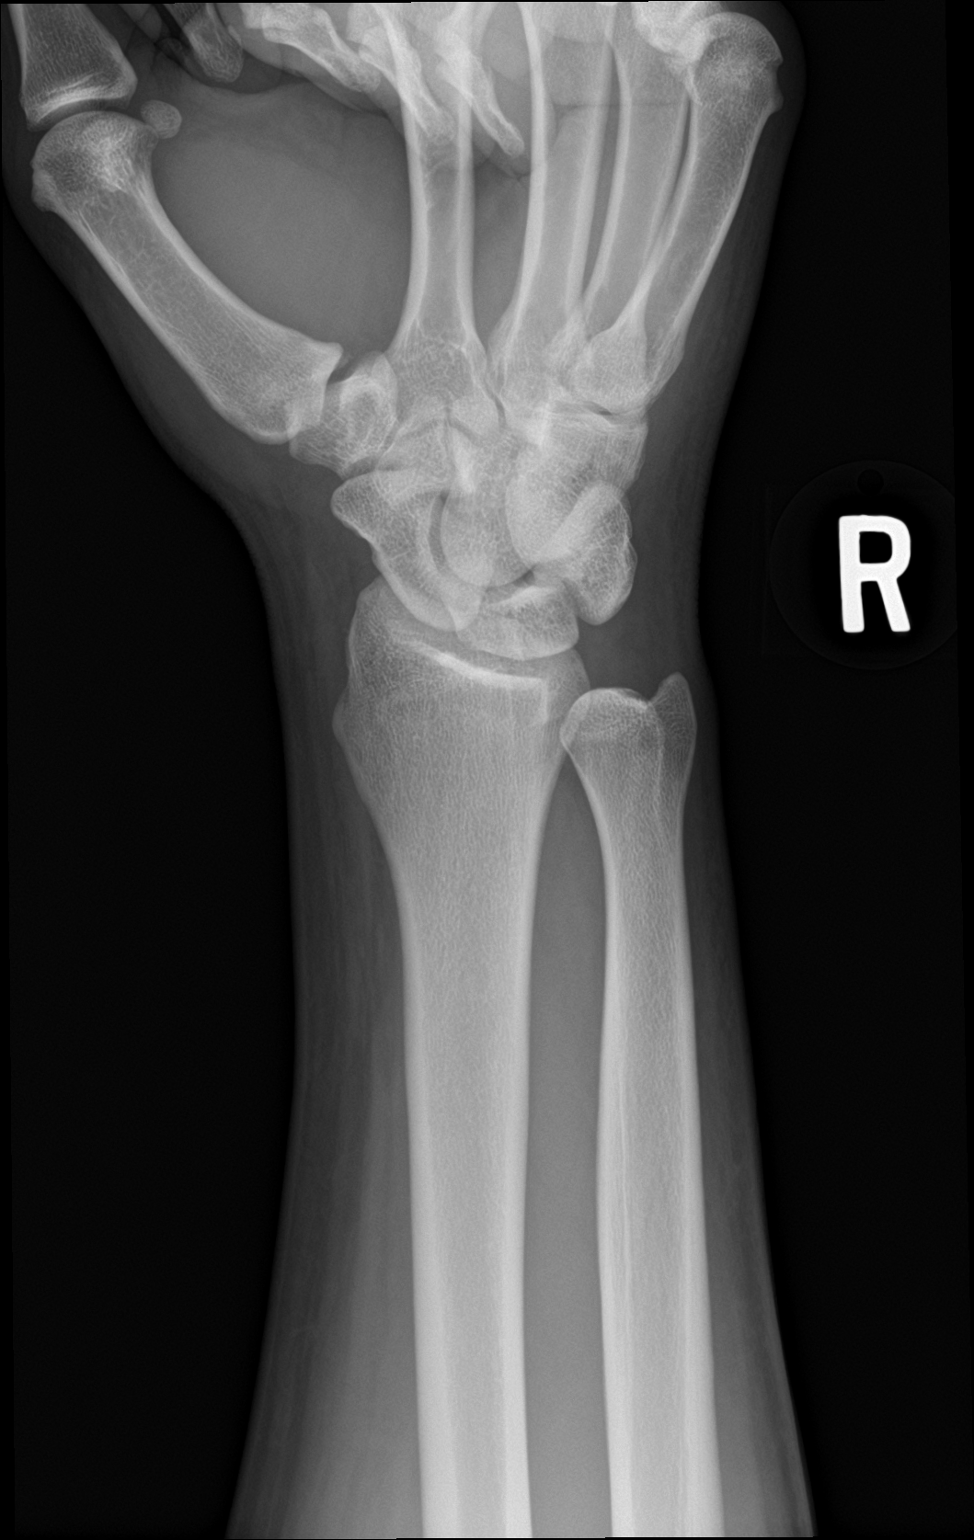

[wrist lat]
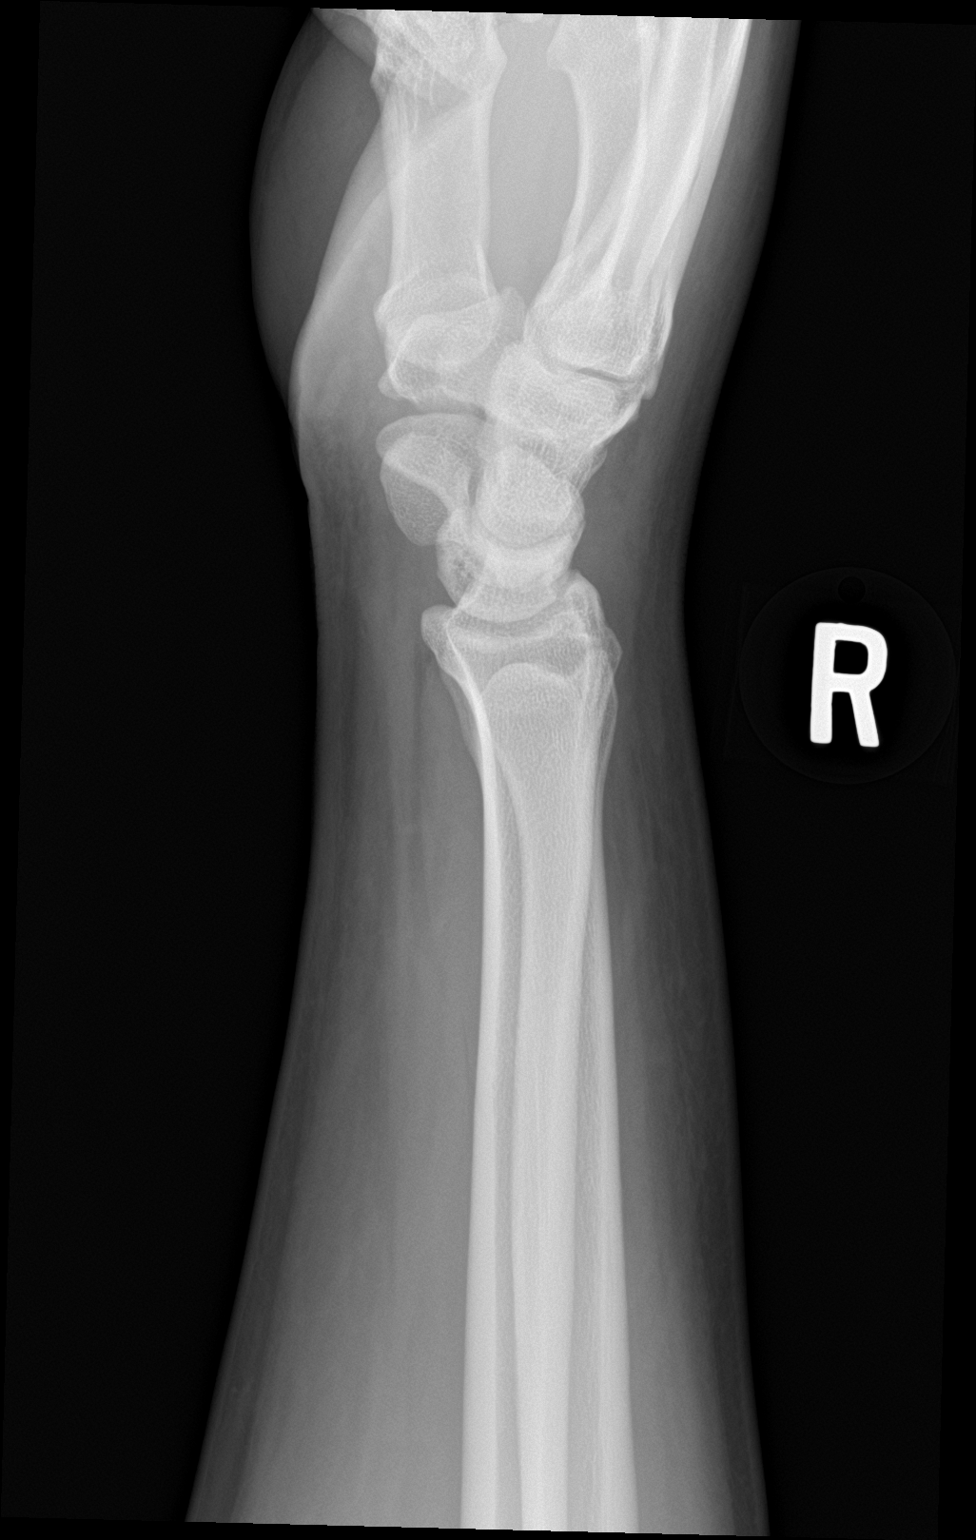

[4 of 4 positions shown; findings below may reference images not displayed]

FINDINGS: There is no evidence of fracture or dislocation. There is no
evidence of arthropathy or other focal bone abnormality. Soft
tissues are unremarkable.
IMPRESSION: Negative.
